# Patient Record
Sex: Female | Born: 1995 | Race: Black or African American | Hispanic: No | Marital: Single | Smoking: Current every day smoker
Health system: Southern US, Community
[De-identification: ages and names within clinical notes are randomized; demographics above are authoritative.]

## PROBLEM LIST (undated history)

## (undated) ENCOUNTER — Inpatient Hospital Stay (HOSPITAL_COMMUNITY): Payer: Self-pay

## (undated) DIAGNOSIS — J45909 Unspecified asthma, uncomplicated: Secondary | ICD-10-CM

## (undated) HISTORY — PX: ROOT CANAL: SHX2363

## (undated) HISTORY — PX: APPENDECTOMY: SHX54

---
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
## 2015-02-12 ENCOUNTER — Encounter (HOSPITAL_COMMUNITY): Payer: Self-pay | Admitting: Emergency Medicine

## 2015-02-12 ENCOUNTER — Emergency Department (HOSPITAL_COMMUNITY)
Admission: EM | Admit: 2015-02-12 | Discharge: 2015-02-12 | Disposition: A | Discharge status: Home / Self Care | Payer: Medicaid Other | Attending: Emergency Medicine | Admitting: Emergency Medicine

## 2015-02-12 DIAGNOSIS — L910 Hypertrophic scar: Secondary | ICD-10-CM

## 2015-02-12 DIAGNOSIS — J45909 Unspecified asthma, uncomplicated: Secondary | ICD-10-CM | POA: Diagnosis not present

## 2015-02-12 DIAGNOSIS — H9201 Otalgia, right ear: Secondary | ICD-10-CM | POA: Diagnosis not present

## 2015-02-12 DIAGNOSIS — Z791 Long term (current) use of non-steroidal anti-inflammatories (NSAID): Secondary | ICD-10-CM | POA: Insufficient documentation

## 2015-02-12 DIAGNOSIS — R51 Headache: Secondary | ICD-10-CM | POA: Diagnosis not present

## 2015-02-12 MED ORDER — CLINDAMYCIN HCL 150 MG PO CAPS: 300.0000 mg | ORAL_CAPSULE | Freq: Three times a day (TID) | ORAL | Status: DC

## 2015-02-12 NOTE — ED Provider Notes (Signed)
CSN: [id number]     Arrival date & time 02/12/15  2056 History  This chart was scribed for a non-physician practitioner, Arthor Captain, PA-C working with Linwood Dibbles, MD by [place] Peace, ED Scribe. The patient was seen in TR11C/TR11C. The patient's care was started at 9:45 PM.    Chief Complaint  Patient presents with  . Headache  . Otalgia      Patient is a 19 y.o. female presenting with headaches and ear pain. The history is provided by the patient. No language interpreter was used.  Headache Associated symptoms: ear pain   Associated symptoms: no fever, no nausea and no vomiting   Otalgia Associated symptoms: headaches   Associated symptoms: no fever and no vomiting   HPI Comments: Adrienne Maldonado is a 19 y.o. female who presents to the Emergency Department complaining of progressively worsening external right ear pain x 4 days with associated "scalp-like" headache. Pt reports pain is primarily to keloid that is attached posteriorly to right ear. Rates pain as 8/10 currently. Pt states she had keloid surgically removed in Jan 2015 but explains it has grown back larger than it was before. No complaints of fever, chills, nausea, or vomiting. History of asthma.     Past Medical History  Diagnosis Date  . Asthma    Past Surgical History  Procedure Laterality Date  . Appendectomy     Family History  Problem Relation Age of Onset  . Diabetes Other   . Hypertension Other    History  Substance Use Topics  . Smoking status: Never Smoker   . Smokeless tobacco: Not on file  . Alcohol Use: No   OB History    No data available     Review of Systems  Constitutional: Negative for fever and chills.  HENT: Positive for ear pain.   Gastrointestinal: Negative for nausea and vomiting.  Neurological: Positive for headaches.      Allergies  Concerta  Home Medications   Prior to Admission medications   Medication Sig Start Date End Date Taking? Authorizing Provider   acetaminophen-codeine (TYLENOL #3) 300-30 MG per tablet Take 1 tablet by mouth every 4 (four) hours as needed. For tooth pain    Historical Provider, MD  ibuprofen (ADVIL,MOTRIN) 800 MG tablet Take 1 tablet (800 mg total) by mouth 3 (three) times daily. 12/02/13   Arthor Captain, PA-C  ibuprofen (ADVIL,MOTRIN) 800 MG tablet Take 1 tablet (800 mg total) by mouth every 6 (six) hours as needed. 06/14/14   Tamika Bush, DO  traMADol (ULTRAM) 50 MG tablet Take 1 tablet (50 mg total) by mouth every 6 (six) hours as needed. 10/09/14   Shalynn Jorstad, PA-C   BP 121/63 mmHg  Pulse 82  Temp(Src) 98.6 F (37 C)  Resp 18  Ht  (1.676 m)  Wt 210 lb (95.255 kg)  BMI 33.91 kg/m2  SpO2 100%  LMP 01/26/2015 Physical Exam  Constitutional: She is oriented to person, place, and time. She appears well-developed and well-nourished. No distress.  HENT:  Head: Normocephalic and atraumatic.  No internal ear pain.   Eyes: Conjunctivae and EOM are normal.  Neck: Neck supple. No tracheal deviation present.  Cardiovascular: Normal rate.   Pulmonary/Chest: Effort normal. No respiratory distress.  Musculoskeletal: Normal range of motion.  Neurological: She is alert and oriented to person, place, and time.  Skin: Skin is warm and dry.  Psychiatric: She has a normal mood and affect. Her behavior is normal.  Nursing note and  vitals reviewed.   ED Course  Procedures (including critical care time) Labs Review Labs Reviewed - No data to display  Imaging Review No results found.   EKG Interpretation None     Medications - No data to display  9:49 PM- Treatment plan was discussed with patient who verbalizes understanding and agrees.   MDM   Final diagnoses:  Earlobe pain, right  Keloid    BP 115/61 mmHg  Pulse 71  Temp(Src) 98.6 F (37 C)  Resp 18  Ht 5\' 6"  (1.676 m)  Wt 210 lb (95.255 kg)  BMI 33.91 kg/m2  SpO2 100%  LMP 01/26/2015\\ Patient likely has developing abscess in the right  earlobe. She has a large keloid present. Ultrasound placed on the scar tissue, did not show any significant area for drainage. Patient placed on clindamycin. I discussed return precautions. She will follow-up with her primary care physician. She appears safe for discharge at this time.  I personally performed the services described in this documentation, which was scribed in my presence. The recorded information has been reviewed and is accurate.      Arthor Captainbigail Jameis Newsham, PA-C 02/12/15 2236  Linwood DibblesJon Knapp, MD 02/12/15 2325

## 2015-02-12 NOTE — Discharge Instructions (Signed)

## 2015-02-12 NOTE — ED Notes (Signed)
PA at BS.  

## 2015-02-12 NOTE — ED Notes (Signed)
Pt. reports headache and right outer ear ache onset last week , denies head injury , no  nausea or vomitting / no fever or chills.

## 2015-02-13 NOTE — Progress Notes (Signed)
ED CM received call from patient concerning Medicaid not covering prescription for Clindamycin. Patient verified x 3 over the phone. Contacted [facility] confirmed non-coverage by Medicaid.  Patient states, she is unable to afford to pay  For medication,  CM  Reviewed record,  Discussed MATCH program and the guidelines including the  $3 co-pay per prescription. Pt enrolled and MATCH letter printed and given to patient with list of participating pharmacies.  Pt verbalized understanding and is agreeable with plan.  [facility] and [facility] given. Pt voiced understanding and appreciation. Reminded patient of the importance of following up with PCP. Pt verbalized understanding teach back done. No further ED CM needs identified.

## 2015-03-20 ENCOUNTER — Encounter (HOSPITAL_COMMUNITY): Payer: Self-pay | Admitting: Emergency Medicine

## 2015-03-20 ENCOUNTER — Emergency Department (HOSPITAL_COMMUNITY)
Admission: EM | Admit: 2015-03-20 | Discharge: 2015-03-21 | Disposition: A | Discharge status: Home / Self Care | Payer: Medicaid Other | Attending: Emergency Medicine | Admitting: Emergency Medicine

## 2015-03-20 ENCOUNTER — Emergency Department (HOSPITAL_COMMUNITY): Payer: Medicaid Other

## 2015-03-20 DIAGNOSIS — W01198A Fall on same level from slipping, tripping and stumbling with subsequent striking against other object, initial encounter: Secondary | ICD-10-CM | POA: Diagnosis not present

## 2015-03-20 DIAGNOSIS — S060X9A Concussion with loss of consciousness of unspecified duration, initial encounter: Secondary | ICD-10-CM | POA: Diagnosis not present

## 2015-03-20 DIAGNOSIS — S0990XA Unspecified injury of head, initial encounter: Secondary | ICD-10-CM | POA: Insufficient documentation

## 2015-03-20 DIAGNOSIS — Y999 Unspecified external cause status: Secondary | ICD-10-CM | POA: Insufficient documentation

## 2015-03-20 DIAGNOSIS — Y9389 Activity, other specified: Secondary | ICD-10-CM | POA: Diagnosis not present

## 2015-03-20 DIAGNOSIS — Y929 Unspecified place or not applicable: Secondary | ICD-10-CM | POA: Insufficient documentation

## 2015-03-20 DIAGNOSIS — J45909 Unspecified asthma, uncomplicated: Secondary | ICD-10-CM | POA: Diagnosis not present

## 2015-03-20 DIAGNOSIS — Z792 Long term (current) use of antibiotics: Secondary | ICD-10-CM | POA: Diagnosis not present

## 2015-03-20 DIAGNOSIS — R51 Headache: Secondary | ICD-10-CM

## 2015-03-20 DIAGNOSIS — R519 Headache, unspecified: Secondary | ICD-10-CM

## 2015-03-20 DIAGNOSIS — R55 Syncope and collapse: Secondary | ICD-10-CM | POA: Diagnosis present

## 2015-03-20 LAB — I-STAT CHEM 8, ED
BUN: 12 mg/dL (ref 6–20)
CALCIUM ION: 1.25 mmol/L — AB (ref 1.12–1.23)
CREATININE: 0.9 mg/dL (ref 0.44–1.00)
Chloride: 101 mmol/L (ref 101–111)
Glucose, Bld: 86 mg/dL (ref 65–99)
HCT: 42 % (ref 36.0–46.0)
Hemoglobin: 14.3 g/dL (ref 12.0–15.0)
POTASSIUM: 3.9 mmol/L (ref 3.5–5.1)
Sodium: 141 mmol/L (ref 135–145)
TCO2: 24 mmol/L (ref 0–100)

## 2015-03-20 LAB — CBC
HCT: 40 % (ref 36.0–46.0)
Hemoglobin: 13.4 g/dL (ref 12.0–15.0)
MCH: 29.1 pg (ref 26.0–34.0)
MCHC: 33.5 g/dL (ref 30.0–36.0)
MCV: 87 fL (ref 78.0–100.0)
PLATELETS: 196 10*3/uL (ref 150–400)
RBC: 4.6 MIL/uL (ref 3.87–5.11)
RDW: 13 % (ref 11.5–15.5)
WBC: 5.3 10*3/uL (ref 4.0–10.5)

## 2015-03-20 LAB — CBG MONITORING, ED: GLUCOSE-CAPILLARY: 122 mg/dL — AB (ref 65–99)

## 2015-03-20 MED ORDER — SODIUM CHLORIDE 0.9 % IV BOLUS (SEPSIS)
1000.0000 mL | Freq: Once | INTRAVENOUS | Status: AC
Start: 2015-03-20 — End: 2015-03-21
  Administered 2015-03-20: 1000 mL via INTRAVENOUS

## 2015-03-20 MED ORDER — DIPHENHYDRAMINE HCL 50 MG/ML IJ SOLN
25.0000 mg | Freq: Once | INTRAMUSCULAR | Status: AC
  Administered 2015-03-20: 25 mg via INTRAVENOUS
  Filled 2015-03-20: qty 1

## 2015-03-20 MED ORDER — DEXAMETHASONE SODIUM PHOSPHATE 10 MG/ML IJ SOLN
10.0000 mg | Freq: Once | INTRAMUSCULAR | Status: AC
  Administered 2015-03-20: 10 mg via INTRAVENOUS
  Filled 2015-03-20: qty 1

## 2015-03-20 MED ORDER — PROCHLORPERAZINE EDISYLATE 5 MG/ML IJ SOLN
10.0000 mg | Freq: Four times a day (QID) | INTRAMUSCULAR | Status: DC | PRN
  Administered 2015-03-20: 10 mg via INTRAVENOUS
  Filled 2015-03-20: qty 2

## 2015-03-20 NOTE — ED Notes (Signed)
Pt states that she ate prior to going to the carwash and felt dizzy so tried to sit down, pt states that her brother stated she hit her head on a brick wall while trying to sit down. Pt denies any cp or sob at this time.

## 2015-03-20 NOTE — ED Provider Notes (Signed)
CSN: [id number]     Arrival date & time 03/20/15  2125 History   First MD Initiated Contact with Patient 03/20/15 2136     Chief Complaint  Patient presents with  . Headache  . Loss of Consciousness   Adrienne Maldonado is a 19 y.o. female With the past medical history significant for asthma who presents for headache and possible syncope. The patient reports that approximately five hours prior to arrival in the ED the patient was standing at a car wash when she began feeling lightheaded and, according to her report, blacked out for several minutes. The patient apparently fell backwards against a wall where she slid down onto her back. The patient says that she woke up and was having a headache for the last several hours. The patient described headache is located in the back of her head and is moderate in severity. The patient says the pain does not radiate. The patient denies any difficulty with ambulation, vision changes, nausea, vomiting, or any neurological deficits. The patient denies any chest pain, shortness of breath, or palpitations. The patient said that she felt her lightheadedness coming on gradually leading to her syncope. The patient says she's never had this before and denies any other symptoms aside from her headache. The patient denies taking any medications and denies any other abnormalities leading up to the event.   (Consider location/radiation/quality/duration/timing/severity/associated sxs/prior Treatment) Patient is a 19 y.o. female presenting with syncope. The history is provided by the patient. No language interpreter was used.  Loss of Consciousness Episode history:  Single Most recent episode:  Today Duration: several. Timing:  Rare Progression:  Resolved Chronicity:  New Context: standing up   Witnessed: yes   Relieved by:  Nothing Worsened by:  Nothing tried Ineffective treatments:  None tried Associated symptoms: headaches   Associated symptoms: no chest pain,  no confusion, no diaphoresis, no dizziness (Lightheadedness), no fever, no focal sensory loss, no focal weakness, no malaise/fatigue, no nausea, no palpitations, no recent fall, no recent injury, no seizures, no shortness of breath, no visual change and no vomiting   Headaches:    Severity:  Moderate   Onset quality:  Gradual   Timing:  Constant   Progression:  Improving   Chronicity:  New   Past Medical History  Diagnosis Date  . Asthma    Past Surgical History  Procedure Laterality Date  . Appendectomy     Family History  Problem Relation Age of Onset  . Diabetes Other   . Hypertension Other    History  Substance Use Topics  . Smoking status: Never Smoker   . Smokeless tobacco: Not on file  . Alcohol Use: No   OB History    No data available     Review of Systems  Constitutional: Negative for fever, chills, malaise/fatigue and diaphoresis.  HENT: Negative for congestion and rhinorrhea.   Respiratory: Negative for cough, choking, chest tightness, shortness of breath, wheezing and stridor.   Cardiovascular: Positive for syncope. Negative for chest pain and palpitations.  Gastrointestinal: Negative for nausea and vomiting.  Genitourinary: Negative for dysuria and flank pain.  Musculoskeletal: Negative for back pain, neck pain and neck stiffness.  Skin: Negative for rash and wound.  Neurological: Positive for syncope, light-headedness and headaches. Negative for dizziness (Lightheadedness), tremors, focal weakness, seizures and facial asymmetry.  Psychiatric/Behavioral: Negative for confusion and agitation.  All other systems reviewed and are negative.     Allergies  Concerta and Ibuprofen  Home Medications  Prior to Admission medications   Medication Sig Start Date End Date Taking? Authorizing Provider  clindamycin (CLEOCIN) 150 MG capsule Take 2 capsules (300 mg total) by mouth 3 (three) times daily. May dispense as  capsules Patient not taking: Reported  on 03/20/2015 02/12/15   Arthor Captain, PA-C  ibuprofen (ADVIL,MOTRIN) 800 MG tablet Take 1 tablet (800 mg total) by mouth 3 (three) times daily. Patient not taking: Reported on 03/20/2015 12/02/13   Arthor Captain, PA-C  ibuprofen (ADVIL,MOTRIN) 800 MG tablet Take 1 tablet (800 mg total) by mouth every 6 (six) hours as needed. Patient not taking: Reported on 03/20/2015 06/14/14   Truddie Coco, DO  traMADol (ULTRAM) 50 MG tablet Take 1 tablet (50 mg total) by mouth every 6 (six) hours as needed. Patient not taking: Reported on 03/20/2015 10/09/14   Arthor Captain, PA-C   BP 127/69 mmHg  Pulse 65  Temp(Src) 98.5 F (36.9 C) (Oral)  Resp 14  Ht  (1.676 m)  Wt 208 lb (94.348 kg)  BMI 33.59 kg/m2  SpO2 99%  LMP 02/25/2015 (Exact Date) Physical Exam  Constitutional: She is oriented to person, place, and time. She appears well-developed and well-nourished. No distress.  HENT:  Head: Normocephalic and atraumatic.  Mouth/Throat: Oropharynx is clear and moist. No oropharyngeal exudate.  Eyes: Conjunctivae are normal. Pupils are equal, round, and reactive to light.  Neck: Normal range of motion.  Cardiovascular: Normal rate, regular rhythm, normal heart sounds and intact distal pulses.   No murmur heard. Pulmonary/Chest: Effort normal and breath sounds normal. No stridor. No respiratory distress.  Abdominal: Soft. Bowel sounds are normal. She exhibits no distension and no mass. There is no tenderness. There is no rebound.  Musculoskeletal: Normal range of motion. She exhibits no tenderness.  Neurological: She is alert and oriented to person, place, and time. She has normal reflexes. She is not disoriented. She displays normal reflexes. No cranial nerve deficit or sensory deficit. She exhibits normal muscle tone. Coordination and gait normal. GCS eye subscore is 4. GCS verbal subscore is 5. GCS motor subscore is 6.  Skin: Skin is warm. She is not diaphoretic. No pallor.  Psychiatric: She has a  normal mood and affect.  Nursing note and vitals reviewed.   ED Course  Procedures (including critical care time) Labs Review Labs Reviewed  CBG MONITORING, ED - Abnormal; Notable for the following:    Glucose-Capillary 122 (*)    All other components within normal limits  I-STAT CHEM 8, ED - Abnormal; Notable for the following:    Calcium, Ion 1.25 (*)    All other components within normal limits  CBC  I-STAT BETA HCG BLOOD, ED ([facility], [facility], [facility] ONLY)    Imaging Review Dg Chest 2 View  03/20/2015   CLINICAL DATA:  Acute onset of dizziness. Status post fall backwards, hitting head on brick wall. Concern for chest injury. Initial encounter.  EXAM: CHEST  2 VIEW  COMPARISON:  None.  FINDINGS: The lungs are well-aerated and clear. There is no evidence of focal opacification, pleural effusion or pneumothorax.  The heart is normal in size; the mediastinal contour is within normal limits. No acute osseous abnormalities are seen.  IMPRESSION: No acute cardiopulmonary process seen. No displaced rib fractures identified.   Electronically Signed   By: Roanna Raider M.D.   On: 03/20/2015 23:51   Ct Head Wo Contrast  03/20/2015   CLINICAL DATA:  Acute onset of posterior headache. Syncope. Dizziness. Initial encounter.  EXAM:  CT HEAD WITHOUT CONTRAST  TECHNIQUE: Contiguous axial images were obtained from the base of the skull through the vertex without intravenous contrast.  COMPARISON:  None.  FINDINGS: There is no evidence of acute infarction, mass lesion, or intra- or extra-axial hemorrhage on CT.  The posterior fossa, including the cerebellum, brainstem and fourth ventricle, is within normal limits. The third and lateral ventricles, and basal ganglia are unremarkable in appearance. The cerebral hemispheres are symmetric in appearance, with normal gray-white differentiation. No mass effect or midline shift is seen.  There is no evidence of fracture; visualized osseous structures are unremarkable in  appearance. The visualized portions of the orbits are within normal limits. The paranasal sinuses and mastoid air cells are well-aerated. No significant soft tissue abnormalities are seen.  IMPRESSION: Unremarkable noncontrast CT of the head.   Electronically Signed   By: Roanna RaiderJeffery  Chang M.D.   On: 03/20/2015 23:45     EKG Interpretation   Date/Time:  Friday Mar 20 2015 21:34:01 EDT Ventricular Rate:  70 PR Interval:  142 QRS Duration: 71 QT Interval:  382 QTC Calculation: 412 R Axis:   60 Text Interpretation:  Sinus rhythm No previous tracing Confirmed by Denton LankSTEINL   MD, Caryn BeeKEVIN ([id number]) on 03/20/2015 10:07:32 PM      MDM   Adrienne Maldonado is a 19 y.o. female With the past medical history significant for asthma who presents for headache and syncope. The patient denied any chest pain or palpitations preceding her symptoms however, she did feel lightheadedness gradually coming on beforehand. Following the patient's fall, the patient had headache prompting her to come for evaluation. On exam, the patient did not have any neurological deficits. The patient did not have any evidence of trauma on inspection of her head.  Given the patient's headache in the setting of possible head trauma, the patient had a CT scan and diagnostic laboratory, EKG, and imaging testing to look for etiology of her syncope.  The patient's entire workup was reassuring for no abnormalities Aside from a slightly elevated calcium of 1.25.  The patient was given a migraine cocktail for headache which completely resolved headache on reassessment.  The patient was reassured about her findings and her headache resolved. The patient was instructed to follow up with her PCP for further management of her syncope and was also given return precautions for any new or worsening symptoms. The patient voiced understanding of the plan of care, had no other questions, concerns, or complaints, and the patient was discharged in good  condition.  This patient seen with Dr. Denton LankSteinl, emergency medicine attending.  Final diagnoses:  Acute nonintractable headache, unspecified headache type      Theda Belfasthris Carrisa Keller, MD 03/21/15 [phone]  Cathren LaineKevin Steinl, MD 03/24/15 [phone]

## 2015-03-20 NOTE — ED Notes (Signed)
Per EMS: pt from home for eval of posterior headache that started after pt states she had syncopal episode. Pt states at 1700 pt felt dizzy while washing a car and "passed out " while trying to get in the car. Unsure of what she hit her head on. Pt states headache since, EMS noted pt alert and oriented, nad noted.

## 2015-03-21 LAB — I-STAT BETA HCG BLOOD, ED (MC, WL, AP ONLY): I-stat hCG, quantitative: <5 m[IU]/mL (ref <5)

## 2015-03-21 NOTE — Discharge Instructions (Signed)

## 2015-03-21 NOTE — ED Notes (Signed)
Discharge instructions reviewed, voiced understanding.  

## 2015-10-06 ENCOUNTER — Encounter (HOSPITAL_COMMUNITY): Payer: Self-pay | Admitting: Emergency Medicine

## 2015-10-06 ENCOUNTER — Emergency Department (HOSPITAL_COMMUNITY)
Admission: EM | Admit: 2015-10-06 | Discharge: 2015-10-06 | Discharge status: Against Medical Advice | Payer: Medicaid Other | Attending: Emergency Medicine | Admitting: Emergency Medicine

## 2015-10-06 ENCOUNTER — Emergency Department (HOSPITAL_COMMUNITY): Payer: Medicaid Other

## 2015-10-06 DIAGNOSIS — Y9289 Other specified places as the place of occurrence of the external cause: Secondary | ICD-10-CM | POA: Diagnosis not present

## 2015-10-06 DIAGNOSIS — Y9389 Activity, other specified: Secondary | ICD-10-CM | POA: Insufficient documentation

## 2015-10-06 DIAGNOSIS — J45909 Unspecified asthma, uncomplicated: Secondary | ICD-10-CM | POA: Diagnosis not present

## 2015-10-06 DIAGNOSIS — W2209XA Striking against other stationary object, initial encounter: Secondary | ICD-10-CM | POA: Insufficient documentation

## 2015-10-06 DIAGNOSIS — S6992XA Unspecified injury of left wrist, hand and finger(s), initial encounter: Secondary | ICD-10-CM | POA: Diagnosis not present

## 2015-10-06 DIAGNOSIS — Y998 Other external cause status: Secondary | ICD-10-CM | POA: Insufficient documentation

## 2015-10-06 DIAGNOSIS — F17210 Nicotine dependence, cigarettes, uncomplicated: Secondary | ICD-10-CM | POA: Insufficient documentation

## 2015-10-06 NOTE — ED Notes (Signed)
Pt brought in by EMS with c/o hand pain  Pt states she hit a wall  Pt states it hurts to move her hand and wrist  No obvious deformity noted

## 2015-10-06 NOTE — ED Notes (Signed)
Patient got back to the assigned hall bed and decided she wanted to leave. She states "I know it is not broken". Pt was alert, oriented x 4, and able to move hand while talking. Pt refused to sign leave without being seen form.

## 2015-10-06 NOTE — ED Notes (Signed)
Pt walked back to [facility] and asked how long the wait would be til the doctor saw them, when told it might be awhile that they still had several in front of them to be seen by the provider pt said" I know its not broke so im just going to go on home." Pt was walked back to lobby. To wait for a ride. RN notified

## 2015-11-12 ENCOUNTER — Emergency Department (HOSPITAL_COMMUNITY): Payer: Medicaid Other

## 2015-11-12 ENCOUNTER — Emergency Department (HOSPITAL_COMMUNITY)
Admission: EM | Admit: 2015-11-12 | Discharge: 2015-11-12 | Disposition: A | Discharge status: Home / Self Care | Payer: Medicaid Other | Attending: Emergency Medicine | Admitting: Emergency Medicine

## 2015-11-12 ENCOUNTER — Encounter (HOSPITAL_COMMUNITY): Payer: Self-pay | Admitting: Emergency Medicine

## 2015-11-12 DIAGNOSIS — S6992XA Unspecified injury of left wrist, hand and finger(s), initial encounter: Secondary | ICD-10-CM | POA: Diagnosis present

## 2015-11-12 DIAGNOSIS — W2209XA Striking against other stationary object, initial encounter: Secondary | ICD-10-CM | POA: Diagnosis not present

## 2015-11-12 DIAGNOSIS — Y998 Other external cause status: Secondary | ICD-10-CM | POA: Diagnosis not present

## 2015-11-12 DIAGNOSIS — J45909 Unspecified asthma, uncomplicated: Secondary | ICD-10-CM | POA: Diagnosis not present

## 2015-11-12 DIAGNOSIS — S60222A Contusion of left hand, initial encounter: Secondary | ICD-10-CM | POA: Diagnosis not present

## 2015-11-12 DIAGNOSIS — Y9289 Other specified places as the place of occurrence of the external cause: Secondary | ICD-10-CM | POA: Diagnosis not present

## 2015-11-12 DIAGNOSIS — F17210 Nicotine dependence, cigarettes, uncomplicated: Secondary | ICD-10-CM | POA: Diagnosis not present

## 2015-11-12 DIAGNOSIS — Y9389 Activity, other specified: Secondary | ICD-10-CM | POA: Insufficient documentation

## 2015-11-12 MED ORDER — TRAMADOL HCL 50 MG PO TABS: 50.0000 mg | ORAL_TABLET | Freq: Four times a day (QID) | ORAL | Status: DC | PRN

## 2015-11-12 NOTE — Discharge Instructions (Signed)
Hand Contusion  A hand contusion is a deep bruise on your hand area. Contusions are the result of an injury that caused bleeding under the skin. The contusion may turn blue, purple, or yellow. Minor injuries will give you a painless contusion, but more severe contusions may stay painful and swollen for a few weeks.  CAUSES   A contusion is usually caused by a blow, trauma, or direct force to an area of the body.  SYMPTOMS    Swelling and redness of the injured area.   Discoloration of the injured area.   Tenderness and soreness of the injured area.   Pain.  DIAGNOSIS   The diagnosis can be made by taking a history and performing a physical exam. An X-ray, CT scan, or MRI may be needed to determine if there were any associated injuries, such as broken bones (fractures).  TREATMENT   Often, the best treatment for a hand contusion is resting, elevating, icing, and applying cold compresses to the injured area. Over-the-counter medicines may also be recommended for pain control.  HOME CARE INSTRUCTIONS    Put ice on the injured area.    Put ice in a plastic bag.    Place a towel between your skin and the bag.    Leave the ice on for 15-20 minutes, 03-04 times a day.   Only take over-the-counter or prescription medicines as directed by your caregiver. Your caregiver may recommend avoiding anti-inflammatory medicines (aspirin, ibuprofen, and naproxen) for 48 hours because these medicines may increase bruising.   If told, use an elastic wrap as directed. This can help reduce swelling. You may remove the wrap for sleeping, showering, and bathing. If your fingers become numb, cold, or blue, take the wrap off and reapply it more loosely.   Elevate your hand with pillows to reduce swelling.   Avoid overusing your hand if it is painful.  SEEK IMMEDIATE MEDICAL CARE IF:    You have increased redness, swelling, or pain in your hand.   Your swelling or pain is not relieved with medicines.   You have loss of feeling in  your hand or are unable to move your fingers.   Your hand turns cold or blue.   You have pain when you move your fingers.   Your hand becomes warm to the touch.   Your contusion does not improve in 2 days.  MAKE SURE YOU:    Understand these instructions.   Will watch your condition.   Will get help right away if you are not doing well or get worse.     This information is not intended to replace advice given to you by your health care provider. Make sure you discuss any questions you have with your health care provider.     Document Released: 04/01/2002 Document Revised: 07/04/2012 Document Reviewed: 04/02/2012  Elsevier Interactive Patient Education 2016 [employer]

## 2015-11-12 NOTE — ED Notes (Signed)
Pt punched the wall with her left hand about 2 days ago.

## 2015-11-12 NOTE — ED Provider Notes (Signed)
CSN: [id number]     Arrival date & time 11/12/15  1823 History  By signing my name below, I, Adrienne Maldonado, attest that this documentation has been prepared under the direction and in the presence of Langston Masker, PA-C Electronically Signed: Soijett Maldonado, ED Scribe. 11/12/2015. 6:46 PM.   Chief Complaint  Patient presents with  . Hand Injury      The history is provided by the patient. No language interpreter was used.    Adrienne Maldonado is a 20 y.o. female who presents to the Emergency Department complaining of left hand injury with associated pain onset last night. She reports that she punched the wall with her left hand. Pt is unsure of the status of her last tetanus shot at this time. Pt is having associated symptoms of joint swelling, left middle finger pain, and left wrist pain. She notes that she has tried ice with no relief of her symptoms. She denies color change, wound, and any other symptoms. She is allergic to ibuprofen and concerta.   Past Medical History  Diagnosis Date  . Asthma    Past Surgical History  Procedure Laterality Date  . Appendectomy     Family History  Problem Relation Age of Onset  . Diabetes Other   . Hypertension Other   . CAD Other    Social History  Substance Use Topics  . Smoking status: Current Some Day Smoker    Types: Cigars  . Smokeless tobacco: None  . Alcohol Use: No   OB History    No data available     Review of Systems  Musculoskeletal: Positive for joint swelling and arthralgias.  Skin: Negative for color change, rash and wound.  All other systems reviewed and are negative.     Allergies  Concerta and Ibuprofen  Home Medications   Prior to Admission medications   Medication Sig Start Date End Date Taking? Authorizing Provider  clindamycin (CLEOCIN) 150 MG capsule Take 2 capsules (300 mg total) by mouth 3 (three) times daily. May dispense as  capsules Patient not taking: Reported on 03/20/2015 02/12/15   Arthor Captain, PA-C  traMADol (ULTRAM) 50 MG tablet Take 1 tablet (50 mg total) by mouth every 6 (six) hours as needed. 11/12/15   Lonia Skinner Sally-Anne Wamble, PA-C   BP 130/71 mmHg  Pulse 78  Temp(Src) 98.5 F (36.9 C) (Oral)  Resp 20  SpO2 100%  LMP 10/16/2015 Physical Exam  Constitutional: She is oriented to person, place, and time. She appears well-developed and well-nourished. No distress.  HENT:  Head: Normocephalic and atraumatic.  Eyes: EOM are normal.  Neck: Neck supple.  Cardiovascular: Normal rate.   Pulmonary/Chest: Effort normal. No respiratory distress.  Abdominal: Soft. There is no tenderness.  Musculoskeletal: Normal range of motion.  Swollen left hand. Abrasion left third metacarpal head.   Neurological: She is alert and oriented to person, place, and time.  Skin: Skin is warm and dry.  Psychiatric: She has a normal mood and affect. Her behavior is normal.  Nursing note and vitals reviewed.   ED Course  Procedures (including critical care time) DIAGNOSTIC STUDIES: Oxygen Saturation is 100% on RA, nl by my interpretation.    COORDINATION OF CARE: 6:46 PM Discussed treatment plan with pt at bedside which includes left hand and wrist xray and pt agreed to plan.    Labs Review Labs Reviewed - No data to display  Imaging Review Dg Wrist Complete Left  11/12/2015  CLINICAL DATA:  20 year old  female punched a wall today complaining of pain in the left wrist and hand. EXAM: LEFT WRIST - COMPLETE 3+ VIEW COMPARISON:  No priors. FINDINGS: Multiple views of the left wrist demonstrate no acute displaced fracture, subluxation, dislocation, or soft tissue abnormality. IMPRESSION: No acute radiographic abnormality of the left wrist. Electronically Signed   By: Trudie Reed M.D.   On: 11/12/2015 19:11   Dg Hand Complete Left  11/12/2015  CLINICAL DATA:  Left wrist and middle finger pain after punching a wall. EXAM: LEFT HAND - COMPLETE 3+ VIEW COMPARISON:  10/06/2015 FINDINGS: There is  no evidence of fracture or dislocation. There is no evidence of arthropathy or other focal bone abnormality. Soft tissues are unremarkable. IMPRESSION: Negative. Electronically Signed   By: Gaylyn Rong M.D.   On: 11/12/2015 19:12   I have personally reviewed and evaluated these images as part of my medical decision-making.   EKG Interpretation None      MDM   Final diagnoses:  Contusion of left hand, initial encounter    Ace wrap Ibuprofen Schedule to see Dr. Janee Morn if pain persist past one week  I personally performed the services in this documentation, which was scribed in my presence.  The recorded information has been reviewed and considered.   Barnet Pall.  Lonia Skinner [place], PA-C 11/12/15 1931  Eber Hong, MD 11/13/15 [phone]

## 2016-01-10 ENCOUNTER — Encounter (HOSPITAL_COMMUNITY): Payer: Self-pay | Admitting: Emergency Medicine

## 2016-01-10 ENCOUNTER — Emergency Department (HOSPITAL_COMMUNITY)
Admission: EM | Admit: 2016-01-10 | Discharge: 2016-01-10 | Disposition: A | Discharge status: Home / Self Care | Payer: Medicaid Other | Attending: Emergency Medicine | Admitting: Emergency Medicine

## 2016-01-10 DIAGNOSIS — K029 Dental caries, unspecified: Secondary | ICD-10-CM | POA: Insufficient documentation

## 2016-01-10 DIAGNOSIS — K08409 Partial loss of teeth, unspecified cause, unspecified class: Secondary | ICD-10-CM | POA: Insufficient documentation

## 2016-01-10 DIAGNOSIS — F17210 Nicotine dependence, cigarettes, uncomplicated: Secondary | ICD-10-CM | POA: Insufficient documentation

## 2016-01-10 DIAGNOSIS — J45909 Unspecified asthma, uncomplicated: Secondary | ICD-10-CM | POA: Diagnosis not present

## 2016-01-10 DIAGNOSIS — K0889 Other specified disorders of teeth and supporting structures: Secondary | ICD-10-CM | POA: Diagnosis not present

## 2016-01-10 MED ORDER — BUPIVACAINE-EPINEPHRINE (PF) 0.5% -1:200000 IJ SOLN
1.8000 mL | Freq: Once | INTRAMUSCULAR | Status: AC
  Administered 2016-01-10: 1.8 mL

## 2016-01-10 MED ORDER — NAPROXEN 250 MG PO TABS
500.0000 mg | ORAL_TABLET | Freq: Once | ORAL | Status: AC
  Administered 2016-01-10: 500 mg via ORAL
  Filled 2016-01-10: qty 2

## 2016-01-10 NOTE — ED Provider Notes (Signed)
CSN: [id number]     Arrival date & time 01/10/16  1924 History  By signing my name below, I, [name], attest that this documentation has been prepared under the direction and in the presence of  Federated Department StoresHanna Patel-Mills, PA-C. Electronically Signed: Doreatha MartinEva Mathews, ED Scribe. 01/10/2016. 7:41 PM.    No chief complaint on file.  The history is provided by the patient. No language interpreter was used.   HPI Comments: Adrienne Maldonado is a 20 y.o. female who presents to the Emergency Department complaining of moderate right lower dental pain onset one week ago. Pt states she was seen by [facility] 5 days ago for her pain and was prescribed amoxicillin. She notes no relief of pain with this treatment. Pt states she has been taking amoxicillin as prescribed. She states she is in the process of scheduling a root canal pending medicaid coverage. Pt denies fever, chills.   Past Medical History  Diagnosis Date  . Asthma    Past Surgical History  Procedure Laterality Date  . Appendectomy     Family History  Problem Relation Age of Onset  . Diabetes Other   . Hypertension Other   . CAD Other    Social History  Substance Use Topics  . Smoking status: Current Some Day Smoker    Types: Cigars  . Smokeless tobacco: Not on file  . Alcohol Use: No   OB History    No data available     Review of Systems  Constitutional: Negative for fever and chills.  HENT: Positive for dental problem.    Allergies  Concerta and Ibuprofen  Home Medications   Prior to Admission medications   Medication Sig Start Date End Date Taking? Authorizing Provider  clindamycin (CLEOCIN) 150 MG capsule Take 2 capsules (300 mg total) by mouth 3 (three) times daily. May dispense as 150mg  capsules Patient not taking: Reported on 03/20/2015 02/12/15   Arthor CaptainAbigail Harris, PA-C  traMADol (ULTRAM) 50 MG tablet Take 1 tablet (50 mg total) by mouth every 6 (six) hours as needed. 11/12/15   Lonia SkinnerLeslie K Sofia, PA-C   BP 135/73 mmHg   Pulse 74  Temp(Src) 98.6 F (37 C) (Oral)  Resp 18  Ht 5' 6.5" (1.689 m)  Wt 90.719 kg  BMI 31.80 kg/m2  SpO2 97% Physical Exam  Constitutional: She is oriented to person, place, and time. She appears well-developed and well-nourished.  HENT:  Head: Normocephalic and atraumatic.  Mouth/Throat: Uvula is midline and oropharynx is clear and moist.    No sign of Ludwig's angina.   Eyes: Conjunctivae and EOM are normal. Pupils are equal, round, and reactive to light.  Neck: Normal range of motion. Neck supple.  Cardiovascular: Normal rate.   Pulmonary/Chest: Effort normal. No respiratory distress.  Abdominal: She exhibits no distension.  Musculoskeletal: Normal range of motion.  Neurological: She is alert and oriented to person, place, and time.  Skin: Skin is warm and dry.  Psychiatric: She has a normal mood and affect. Her behavior is normal.  Nursing note and vitals reviewed.   ED Course  Procedures (including critical care time) DIAGNOSTIC STUDIES: Oxygen Saturation is 97% on RA, normal by my interpretation.    COORDINATION OF CARE: 7:39 PM Discussed treatment plan with pt at bedside which includes dental block and pt agreed to plan.   Dental Block  Consent: Verbal consent obtained. Risks and benefits: risks, benefits and alternatives were discussed Patient identity confirmed: provided demographic data  Time out performed prior  to procedure   Administered 0.7 mL of marcaine w/ EPI.  Pt tolerated procedure well without complication.    MDM   Final diagnoses:  Pain, dental   Meds given in ED:  Medications  bupivacaine-epinephrine (MARCAINE W/ EPI) 0.5% -1:200000 injection 1.8 mL (not administered)  naproxen (NAPROSYN) tablet 500 mg (500 mg Oral Given 01/10/16 2015)    New Prescriptions   No medications on file     BP 135/73 mmHg  Pulse 74  Temp(Src) 98.6 F (37 C) (Oral)  Resp 18  Ht 5' 6.5" (1.689 m)  Wt 90.719 kg  BMI 31.80 kg/m2  SpO2 97%   Here  for evaluation of dental pain. On exam, there is no evidence of a drainable abscess. No trismus, glossal elevation, unilateral tonsillar swelling. No evidence of retropharyngeal or peritonsillar abscess or Ludwig angina. Patient received a dental block in the ED and experiences some relief. Discharged with instructions to follow up with existing dentist. Also given prescription for anti-inflammatories. Patient currently on amoxicillin as prescribed by her doctor 5 days ago. Overall, appears well, nontoxic and appropriate for discharge.    I personally performed the services described in this documentation, which was scribed in my presence. The recorded information has been reviewed and is accurate.   [name], PA-C 01/10/16 2017  Linwood Dibbles, MD 01/10/16 [phone]

## 2016-01-10 NOTE — ED Notes (Signed)
Patient reports a week long history of dental pain.  Pain right lower jaw.

## 2016-01-18 DIAGNOSIS — Z791 Long term (current) use of non-steroidal anti-inflammatories (NSAID): Secondary | ICD-10-CM | POA: Insufficient documentation

## 2016-01-18 DIAGNOSIS — J45909 Unspecified asthma, uncomplicated: Secondary | ICD-10-CM | POA: Diagnosis not present

## 2016-01-18 DIAGNOSIS — F17210 Nicotine dependence, cigarettes, uncomplicated: Secondary | ICD-10-CM | POA: Insufficient documentation

## 2016-01-18 DIAGNOSIS — R51 Headache: Secondary | ICD-10-CM | POA: Insufficient documentation

## 2016-01-18 NOTE — ED Notes (Addendum)
Pt c/o headache and pressure behind her eyes, pt reports taking motrin today with no relief. Pt ambulatory to WR with steady gait Pt c/o HA x 1 week, pt states she has not had a HA this bad in quite a while, +nausea, no light sensitivity.

## 2016-01-19 ENCOUNTER — Encounter (HOSPITAL_COMMUNITY): Payer: Self-pay | Admitting: Emergency Medicine

## 2016-01-19 ENCOUNTER — Emergency Department (HOSPITAL_COMMUNITY)
Admission: EM | Admit: 2016-01-19 | Discharge: 2016-01-19 | Disposition: A | Discharge status: Home / Self Care | Payer: Medicaid Other | Attending: Emergency Medicine | Admitting: Emergency Medicine

## 2016-01-19 DIAGNOSIS — R519 Headache, unspecified: Secondary | ICD-10-CM

## 2016-01-19 DIAGNOSIS — R51 Headache: Secondary | ICD-10-CM

## 2016-01-19 MED ORDER — METOCLOPRAMIDE HCL 5 MG/ML IJ SOLN
10.0000 mg | Freq: Once | INTRAMUSCULAR | Status: AC
  Administered 2016-01-19: 10 mg via INTRAMUSCULAR
  Filled 2016-01-19: qty 2

## 2016-01-19 MED ORDER — DIPHENHYDRAMINE HCL 25 MG PO CAPS
25.0000 mg | ORAL_CAPSULE | Freq: Once | ORAL | Status: AC
  Administered 2016-01-19: 25 mg via ORAL
  Filled 2016-01-19: qty 1

## 2016-01-19 NOTE — ED Notes (Signed)
NP at bedside.

## 2016-01-19 NOTE — ED Provider Notes (Signed)
CSN: [id number]     Arrival date & time 01/18/16  2252 History   First MD Initiated Contact with Patient 01/19/16 0110     Chief Complaint  Patient presents with  . Headache     (Consider location/radiation/quality/duration/timing/severity/associated sxs/prior Treatment) HPI Comments: Is a 20 year old who presents with one-week worth of left temporal headache.  She states this is typical headache occasion for her, but it's lasting longer.  She states s.  No sinus congestion or recent viral illness .  No urinary tract symptoms he's taken over-the-counter Advil without any complaint.  She has nausea.  No photophobia, no visual disturbance  Patient is a 20 y.o. female presenting with headaches. The history is provided by the patient.  Headache Pain location:  L temporal Quality:  Dull Severity at highest:  6/10 Onset quality:  Gradual Duration:  1 week Timing:  Constant Progression:  Unchanged Chronicity:  Recurrent Similar to prior headaches: yes   Context: not activity, not exposure to bright light, not caffeine, not coughing, not eating, not exposure to cold air and not straining   Relieved by:  Nothing Worsened by:  Nothing Ineffective treatments:  NSAIDs Associated symptoms: no congestion, no dizziness, no nausea, no photophobia and no sinus pressure     Past Medical History  Diagnosis Date  . Asthma    Past Surgical History  Procedure Laterality Date  . Appendectomy     Family History  Problem Relation Age of Onset  . Diabetes Other   . Hypertension Other   . CAD Other    Social History  Substance Use Topics  . Smoking status: Current Some Day Smoker    Types: Cigars  . Smokeless tobacco: None  . Alcohol Use: No   OB History    No data available     Review of Systems  Constitutional: Negative for activity change and appetite change.  HENT: Negative for congestion and sinus pressure.   Eyes: Negative for photophobia.  Gastrointestinal: Negative for nausea.   Neurological: Positive for headaches. Negative for dizziness.  All other systems reviewed and are negative.     Allergies  Concerta and Ibuprofen  Home Medications   Prior to Admission medications   Medication Sig Start Date End Date Taking? Authorizing Provider  naproxen sodium (ANAPROX) 220 MG tablet Take 220 mg by mouth 2 (two) times daily with a meal.   Yes Historical Provider, MD  clindamycin (CLEOCIN) 150 MG capsule Take 2 capsules (300 mg total) by mouth 3 (three) times daily. May dispense as 150mg  capsules Patient not taking: Reported on 03/20/2015 02/12/15   Arthor CaptainAbigail Harris, PA-C  traMADol (ULTRAM) 50 MG tablet Take 1 tablet (50 mg total) by mouth every 6 (six) hours as needed. Patient not taking: Reported on 01/19/2016 11/12/15   Elson AreasLeslie K Sofia, PA-C   BP 119/74 mmHg  Pulse 75  Temp(Src) 97.8 F (36.6 C) (Oral)  Resp 20  SpO2 97%  LMP 12/22/2015 Physical Exam  Constitutional: She appears well-developed and well-nourished.  HENT:  Head: Normocephalic.  Eyes: Pupils are equal, round, and reactive to light.  Neck: Normal range of motion.  Cardiovascular: Normal rate and regular rhythm.   Pulmonary/Chest: Effort normal.  Musculoskeletal: Normal range of motion.  Neurological: She is alert.  Skin: Skin is warm.  Nursing note and vitals reviewed.   ED Course  Procedures (including critical care time) Labs Review Labs Reviewed - No data to display  Imaging Review No results found. I have personally reviewed and  evaluated these images and lab results as part of my medical decision-making.   EKG Interpretation None     After IM Reglan and PO Benadry HA has improved   MDM   Final diagnoses:  Acute nonintractable headache, unspecified headache type         Earley Favor, NP 01/19/16 0236  Earley Favor, NP 01/19/16 1191  Tomasita Crumble, MD 01/19/16 [id number]

## 2016-01-19 NOTE — Discharge Instructions (Signed)

## 2016-03-05 ENCOUNTER — Encounter (HOSPITAL_COMMUNITY): Payer: Self-pay | Admitting: *Deleted

## 2016-03-05 ENCOUNTER — Emergency Department (HOSPITAL_COMMUNITY)
Admission: EM | Admit: 2016-03-05 | Discharge: 2016-03-05 | Disposition: A | Discharge status: Home / Self Care | Payer: Medicaid Other | Attending: Emergency Medicine | Admitting: Emergency Medicine

## 2016-03-05 DIAGNOSIS — J45909 Unspecified asthma, uncomplicated: Secondary | ICD-10-CM | POA: Diagnosis not present

## 2016-03-05 DIAGNOSIS — F17210 Nicotine dependence, cigarettes, uncomplicated: Secondary | ICD-10-CM | POA: Insufficient documentation

## 2016-03-05 DIAGNOSIS — Z791 Long term (current) use of non-steroidal anti-inflammatories (NSAID): Secondary | ICD-10-CM | POA: Diagnosis not present

## 2016-03-05 DIAGNOSIS — J029 Acute pharyngitis, unspecified: Secondary | ICD-10-CM | POA: Diagnosis present

## 2016-03-05 LAB — RAPID STREP SCREEN (MED CTR MEBANE ONLY): STREPTOCOCCUS, GROUP A SCREEN (DIRECT): NEGATIVE

## 2016-03-05 MED ORDER — IBUPROFEN 400 MG PO TABS
800.0000 mg | ORAL_TABLET | Freq: Once | ORAL | Status: AC
  Administered 2016-03-05: 800 mg via ORAL
  Filled 2016-03-05: qty 4

## 2016-03-05 MED ORDER — DEXAMETHASONE SODIUM PHOSPHATE 10 MG/ML IJ SOLN
10.0000 mg | Freq: Once | INTRAMUSCULAR | Status: AC
  Administered 2016-03-05: 10 mg via INTRAMUSCULAR
  Filled 2016-03-05: qty 1

## 2016-03-05 MED ORDER — IBUPROFEN 800 MG PO TABS: 800.0000 mg | ORAL_TABLET | Freq: Three times a day (TID) | ORAL | Status: DC

## 2016-03-05 NOTE — ED Notes (Signed)
Pt asking how long is the wait

## 2016-03-05 NOTE — ED Provider Notes (Signed)
CSN: [id number]     Arrival date & time 03/05/16  1624 History   First MD Initiated Contact with Patient 03/05/16 1641     Chief Complaint  Patient presents with  . Sore Throat     (Consider location/radiation/quality/duration/timing/severity/associated sxs/prior Treatment) HPI Adrienne Maldonado is a 20 y.o. female with PMH significant for asthma who presents with 3 day history of gradual onset, constant, unchanged sore throat.  Patient states "i feel like my uvula is swollen".  No prior treatment.  Aggravating factors include swallowing.  Able to eat and drink okay.  No fever, chills, ear pain, cough, rhinorrhea, SOB, CP, N/V/D, or abdominal pain.  Past Medical History  Diagnosis Date  . Asthma    Past Surgical History  Procedure Laterality Date  . Appendectomy     Family History  Problem Relation Age of Onset  . Diabetes Other   . Hypertension Other   . CAD Other    Social History  Substance Use Topics  . Smoking status: Current Some Day Smoker    Types: Cigars  . Smokeless tobacco: None  . Alcohol Use: No   OB History    No data available     Review of Systems All other systems negative unless otherwise stated in HPI    Allergies  Concerta and Ibuprofen  Home Medications   Prior to Admission medications   Medication Sig Start Date End Date Taking? Authorizing Provider  clindamycin (CLEOCIN) 150 MG capsule Take 2 capsules (300 mg total) by mouth 3 (three) times daily. May dispense as  capsules Patient not taking: Reported on 03/20/2015 02/12/15   Arthor Captain, PA-C  ibuprofen (ADVIL,MOTRIN) 800 MG tablet Take 1 tablet (800 mg total) by mouth 3 (three) times daily. 03/05/16   Cheri Fowler, PA-C  naproxen sodium (ANAPROX) 220 MG tablet Take 220 mg by mouth 2 (two) times daily with a meal.    Historical Provider, MD  traMADol (ULTRAM) 50 MG tablet Take 1 tablet (50 mg total) by mouth every 6 (six) hours as needed. Patient not taking: Reported on 01/19/2016  11/12/15   Elson Areas, PA-C   BP 125/56 mmHg  Pulse 63  Temp(Src) 98.9 F (37.2 C)  Resp 16  Ht  (1.702 m)  Wt 88.451 kg  BMI 30.53 kg/m2  SpO2 96%  LMP 02/20/2016 Physical Exam  Constitutional: She is oriented to person, place, and time. She appears well-developed and well-nourished. She is active.  Non-toxic appearance. She does not have a sickly appearance. She does not appear ill.  HENT:  Head: Normocephalic and atraumatic.  Right Ear: Tympanic membrane and external ear normal. Tympanic membrane is not erythematous and not bulging.  Left Ear: Tympanic membrane and external ear normal. Tympanic membrane is not erythematous and not bulging.  Nose: Nose normal.  Mouth/Throat: Uvula is midline and mucous membranes are normal. No trismus in the jaw. Uvula swelling (very mild) present. Posterior oropharyngeal edema and posterior oropharyngeal erythema present. No tonsillar abscesses.  Uvula midline, mildly swollen.  Minimal post-oropharyngeal edema and erythema.  Tolerating secretions without difficulty.   Neck: Normal range of motion. Neck supple.  No nuchal rigidity.   Cardiovascular: Normal rate and regular rhythm.   Pulmonary/Chest: Effort normal and breath sounds normal. No respiratory distress. She has no wheezes. She has no rales.  Abdominal: Soft. Bowel sounds are normal. She exhibits no distension. There is no tenderness.  Musculoskeletal: Normal range of motion.  Lymphadenopathy:    She has no  cervical adenopathy.  Neurological: She is alert and oriented to person, place, and time.  Skin: Skin is warm and dry.  Psychiatric: She has a normal mood and affect. Her behavior is normal.    ED Course  Procedures (including critical care time) Labs Review Labs Reviewed  RAPID STREP SCREEN (NOT AT [facility])  CULTURE, GROUP A STREP [facility]    Imaging Review No results found. I have personally reviewed and evaluated these images and lab results as part of my medical  decision-making.   EKG Interpretation None      MDM   Final diagnoses:  Pharyngitis  . Patient presents with sore throat.  VSS, NAD.  Appear well, non-toxic or septic.  Uvula midline.  Tolerating secretions without difficulty.  Doubt peritonsillar abscess or deep space infection.  Able to tolerate PO.  Patient given Decadron and Ibuprofen.  Rapid strep negative.  Plan to discharge home with ibuprofen.  Discussed return precautions.  Patient agrees and acknowledges the above plan for discharge.    Cheri FowlerKayla Killian Ress, PA-C 03/05/16 1812  Gerhard Munchobert Lockwood, MD 03/05/16 2202

## 2016-03-05 NOTE — Discharge Instructions (Signed)

## 2016-03-05 NOTE — ED Notes (Signed)
The pt is c/o a sore throat for 3 days no known temp    Swelling in the back of her throat.. lmp 2 weeks ago

## 2016-03-05 NOTE — ED Notes (Signed)
The pt  Came in by ems amb

## 2016-03-08 LAB — CULTURE, GROUP A STREP (THRC)

## 2016-05-30 ENCOUNTER — Ambulatory Visit (HOSPITAL_BASED_OUTPATIENT_CLINIC_OR_DEPARTMENT_OTHER)
Admit: 2016-05-30 | Discharge: 2016-05-30 | Disposition: A | Discharge status: Home / Self Care | Payer: Medicaid Other | Attending: Emergency Medicine | Admitting: Emergency Medicine

## 2016-05-30 ENCOUNTER — Emergency Department (HOSPITAL_COMMUNITY)
Admission: EM | Admit: 2016-05-30 | Discharge: 2016-05-30 | Disposition: A | Discharge status: Home / Self Care | Payer: Medicaid Other | Attending: Emergency Medicine | Admitting: Emergency Medicine

## 2016-05-30 ENCOUNTER — Encounter (HOSPITAL_COMMUNITY): Payer: Self-pay | Admitting: Vascular Surgery

## 2016-05-30 DIAGNOSIS — M79609 Pain in unspecified limb: Secondary | ICD-10-CM | POA: Diagnosis not present

## 2016-05-30 DIAGNOSIS — F17210 Nicotine dependence, cigarettes, uncomplicated: Secondary | ICD-10-CM | POA: Diagnosis not present

## 2016-05-30 DIAGNOSIS — J45909 Unspecified asthma, uncomplicated: Secondary | ICD-10-CM | POA: Diagnosis not present

## 2016-05-30 DIAGNOSIS — M79662 Pain in left lower leg: Secondary | ICD-10-CM | POA: Insufficient documentation

## 2016-05-30 DIAGNOSIS — Z79899 Other long term (current) drug therapy: Secondary | ICD-10-CM | POA: Insufficient documentation

## 2016-05-30 LAB — BASIC METABOLIC PANEL
ANION GAP: 6 (ref 5–15)
BUN: <5 mg/dL — ABNORMAL LOW (ref 6–20)
CO2: 28 mmol/L (ref 22–32)
Calcium: 9.5 mg/dL (ref 8.9–10.3)
Chloride: 105 mmol/L (ref 101–111)
Creatinine, Ser: 0.83 mg/dL (ref 0.44–1.00)
GFR calc Af Amer: >60 mL/min (ref >60)
Glucose, Bld: 73 mg/dL (ref 65–99)
POTASSIUM: 4.1 mmol/L (ref 3.5–5.1)
Sodium: 139 mmol/L (ref 135–145)

## 2016-05-30 MED ORDER — CYCLOBENZAPRINE HCL 10 MG PO TABS
10.0000 mg | ORAL_TABLET | Freq: Once | ORAL | Status: AC
  Administered 2016-05-30: 10 mg via ORAL
  Filled 2016-05-30: qty 1

## 2016-05-30 MED ORDER — SODIUM CHLORIDE 0.9 % IV BOLUS (SEPSIS): 1000.0000 mL | Freq: Once | INTRAVENOUS | Status: DC

## 2016-05-30 MED ORDER — SODIUM CHLORIDE 0.9 % IV BOLUS (SEPSIS): 20.0000 mL/kg | Freq: Once | INTRAVENOUS | Status: DC

## 2016-05-30 MED ORDER — SODIUM CHLORIDE 0.9 % IV BOLUS (SEPSIS)
1000.0000 mL | Freq: Once | INTRAVENOUS | Status: AC
  Administered 2016-05-30: 1000 mL via INTRAVENOUS

## 2016-05-30 MED ORDER — CYCLOBENZAPRINE HCL 10 MG PO TABS: 10.0000 mg | ORAL_TABLET | Freq: Two times a day (BID) | ORAL | 0 refills | Status: DC | PRN

## 2016-05-30 NOTE — ED Notes (Signed)
MD at bedside. 

## 2016-05-30 NOTE — ED Notes (Signed)
Pt returned from vascular

## 2016-05-30 NOTE — ED Notes (Signed)
Pt transported to vascular.  °

## 2016-05-30 NOTE — ED Provider Notes (Signed)
[facility]-EMERGENCY DEPT Provider Note   CSN: [id number] Arrival date & time: 05/30/16  1318  First Provider Contact: First MD [id number] Contact with Patient 05/30/16 1647     By signing my name below, I, Adrienne Maldonado, attest that this documentation has been prepared under the direction and in the presence of Adrienne Hummeross Alexsandro Salek, MD. Electronically Signed: Rosario AdieWilliam Andrew Maldonado, ED Scribe. 05/30/16. 4:56 PM.  History   Chief Complaint Chief Complaint  Patient presents with  . Leg Pain   The history is provided by the patient. No language interpreter was used.  Leg Pain   This is a new problem. The current episode started more than 2 days ago. Episode frequency: intemittently. The problem has not changed since onset.The pain is present in the left lower leg. The pain is at a severity of 10/10. Pertinent negatives include no numbness and full range of motion. Exacerbated by: ambulation. Treatments tried: stretching. The treatment provided no relief. There has been no history of extremity trauma. Family history is significant for no rheumatoid arthritis and no gout.   HPI Comments: Adrienne Maldonado is a [age] y.o. female with a PMHx of asthma, who presents to the Emergency Department complaining of sudden onset, unchanged, intermittent left sided upper calf pain x ~3 days. Relative reports associated mild swelling to the area since the onset of her pain. Pt reports that she felt a cramp to the area while laying down ~3 days ago. At that time she stood up, and upon getting up she felt a sudden sensation of pain causing her to fall to the ground, and her pain has persisted since. No LOC or head injury upon fall. They have tried stretching the area with minimal improvement of her pain. Her pain is moderately exacerbated with ambulation. No recent injury/trauma to the area or otherwise. No daily medicines or birth control regimen. Pt does smoke cigarettes. Denies fever, weakness, numbness, tingling, or any  other symptoms.    Past Medical History:  Diagnosis Date  . Asthma    There are no active problems to display for this patient.  Past Surgical History:  Procedure Laterality Date  . APPENDECTOMY     OB History    No data available     Home Medications    Prior to Admission medications   Medication Sig Start Date End Date Taking? Authorizing Provider  clindamycin (CLEOCIN) 150 MG capsule Take 2 capsules (300 mg total) by mouth 3 (three) times daily. May dispense as 150mg  capsules Patient not taking: Reported on 03/20/2015 02/12/15   Arthor CaptainAbigail Harris, PA-C  ibuprofen (ADVIL,MOTRIN) 800 MG tablet Take 1 tablet (800 mg total) by mouth 3 (three) times daily. 03/05/16   Cheri FowlerKayla Rose, PA-C  naproxen sodium (ANAPROX) 220 MG tablet Take 220 mg by mouth 2 (two) times daily with a meal.    Historical Provider, MD  traMADol (ULTRAM) 50 MG tablet Take 1 tablet (50 mg total) by mouth every 6 (six) hours as needed. Patient not taking: Reported on 01/19/2016 11/12/15   Elson AreasLeslie K Sofia, PA-C   Family History Family History  Problem Relation Age of Onset  . Diabetes Other   . Hypertension Other   . CAD Other    Social History Social History  Substance Use Topics  . Smoking status: Current Some Day Smoker    Types: Cigars  . Smokeless tobacco: Never Used  . Alcohol use No   Allergies   Concerta [methylphenidate] and Ibuprofen  Review of Systems Review of  Systems  Constitutional: Negative for fever.  Musculoskeletal: Positive for myalgias.  Neurological: Negative for syncope, weakness and numbness.  All other systems reviewed and are negative.  Physical Exam Updated Vital Signs BP 112/64 (BP Location: Left Arm)   Pulse (!) 56   Temp 98 F (36.7 C) (Oral)   Resp 17   SpO2 100%   Physical Exam  Constitutional: She is oriented to person, place, and time. She appears well-developed and well-nourished.  HENT:  Head: Normocephalic and atraumatic.  Right Ear: External ear normal.  Left  Ear: External ear normal.  Mouth/Throat: Oropharynx is clear and moist.  Eyes: Conjunctivae and EOM are normal.  Neck: Normal range of motion. Neck supple.  Cardiovascular: Normal rate, normal heart sounds and intact distal pulses.   Pulmonary/Chest: Effort normal and breath sounds normal.  Abdominal: Soft. Bowel sounds are normal. There is no tenderness. There is no rebound.  Musculoskeletal: Normal range of motion. She exhibits tenderness.  Left calf is tender. Minimal swelling. No warmth to touch. No redness. No pain in the thigh.   Neurological: She is alert and oriented to person, place, and time.  Skin: Skin is warm.  Nursing note and vitals reviewed.  ED Treatments / Results  DIAGNOSTIC STUDIES: Oxygen Saturation is 100% on RA, normal by my interpretation.   COORDINATION OF CARE: 4:56 PM-Discussed next steps with pt. Pt verbalized understanding and is agreeable with the plan.   Labs (all labs ordered are listed, but only abnormal results are displayed) Labs Reviewed - No data to display  Radiology No results found.  Procedures Procedures (including critical care time)  Medications Ordered in ED Medications - No data to display  Initial Impression / Assessment and Plan / ED Course  I have reviewed the triage vital signs and the nursing notes.  Pertinent labs & imaging results that were available during my care of the patient were reviewed by me and considered in my medical decision making (see chart for details).  Clinical Course    20 year old female who presents for left calf pain. The pain started 2 days ago and feels like a muscle cramp to her. However despite stretching she continues to have pain. Patient is obese, and does smoke, however she is not on birth control pills, no recent surgeries or immobilization.  No redness. We'll obtain ultrasound to ensure no DVT. We'll obtain screening baseline labs.  Preliminary vascular report shows no sign of DVT. Patient  feeling a little bit better after IV fluids. We'll discharge home with muscle relaxer. Patient to follow-up with PCP or return here if not improved in 2-3 days. Discussed symptoms that warrant reevaluation.  Final Clinical Impressions(s) / ED Diagnoses   Final diagnoses:  None    New Prescriptions New Prescriptions   No medications on file   I personally performed the services described in this documentation, which was scribed in my presence. The recorded information has been reviewed and is accurate.        Adrienne Hummer, MD 05/30/16 2002

## 2016-05-30 NOTE — Progress Notes (Signed)
Orthopedic Tech Progress Note Patient Details:  Rosey BathShantianna J Bruemmer 1996/06/01 [id number]  Ortho Devices Type of Ortho Device: Crutches Ortho Device/Splint Interventions: Application   Saul FordyceJennifer C Bianna Haran 05/30/2016, 7:32 PM

## 2016-05-30 NOTE — Progress Notes (Signed)
VASCULAR LAB PRELIMINARY  PRELIMINARY  PRELIMINARY  PRELIMINARY  Left lower extremity venous duplex completed.    Preliminary report:  There is no DVT or SVT noted in the left lower extremity.   Yuri Flener, RVT 05/30/2016, 6:38 PM

## 2016-05-30 NOTE — ED Triage Notes (Signed)
Pt reports to the ED for eval of posterior left calf pain. States she got a "charlie-horse" a few days ago and ever since then she has had pain. She also reports it feels like a knot in her left. Some swelling noted to the left leg per pt. Pt denies any CP, SOB, or HA. Pt A&Ox4, resp e/u, and skin warm and dry.

## 2016-06-23 ENCOUNTER — Emergency Department (HOSPITAL_COMMUNITY)
Admission: EM | Admit: 2016-06-23 | Discharge: 2016-06-23 | Disposition: A | Discharge status: Home / Self Care | Payer: Medicaid Other | Attending: Emergency Medicine | Admitting: Emergency Medicine

## 2016-06-23 ENCOUNTER — Encounter (HOSPITAL_COMMUNITY): Payer: Self-pay | Admitting: Emergency Medicine

## 2016-06-23 ENCOUNTER — Emergency Department (HOSPITAL_COMMUNITY): Payer: Medicaid Other

## 2016-06-23 DIAGNOSIS — S60022A Contusion of left index finger without damage to nail, initial encounter: Secondary | ICD-10-CM | POA: Diagnosis not present

## 2016-06-23 DIAGNOSIS — F17210 Nicotine dependence, cigarettes, uncomplicated: Secondary | ICD-10-CM | POA: Diagnosis not present

## 2016-06-23 DIAGNOSIS — Y999 Unspecified external cause status: Secondary | ICD-10-CM | POA: Insufficient documentation

## 2016-06-23 DIAGNOSIS — J45909 Unspecified asthma, uncomplicated: Secondary | ICD-10-CM | POA: Insufficient documentation

## 2016-06-23 DIAGNOSIS — W230XXA Caught, crushed, jammed, or pinched between moving objects, initial encounter: Secondary | ICD-10-CM | POA: Diagnosis not present

## 2016-06-23 DIAGNOSIS — Y929 Unspecified place or not applicable: Secondary | ICD-10-CM | POA: Diagnosis not present

## 2016-06-23 DIAGNOSIS — Y939 Activity, unspecified: Secondary | ICD-10-CM | POA: Diagnosis not present

## 2016-06-23 DIAGNOSIS — Z79899 Other long term (current) drug therapy: Secondary | ICD-10-CM | POA: Insufficient documentation

## 2016-06-23 DIAGNOSIS — S6992XA Unspecified injury of left wrist, hand and finger(s), initial encounter: Secondary | ICD-10-CM | POA: Diagnosis present

## 2016-06-23 DIAGNOSIS — S6000XA Contusion of unspecified finger without damage to nail, initial encounter: Secondary | ICD-10-CM

## 2016-06-23 NOTE — ED Triage Notes (Addendum)
Pt c/o 15 min PTA shut her L pointer finger in a car door. Small puncture wound noted to tip of finger. No bleeding at this time.

## 2016-06-23 NOTE — ED Provider Notes (Signed)
[facility] Provider Note   CSN: [id number] Arrival date & time: 06/23/16  1654  By signing my name below, I, Phillis HaggisGabriella Gaje, attest that this documentation has been prepared under the direction and in the presence of Felicie Mornavid Shaquon Gropp, NP-C. Electronically Signed: Phillis HaggisGabriella Gaje, ED Scribe. 06/23/16. 5:36 PM.  History   Chief Complaint Chief Complaint  Patient presents with  . Hand Pain   The history is provided by the patient. No language interpreter was used.  Hand Pain  This is a new problem. The current episode started less than 1 hour ago. The problem occurs constantly. The problem has been gradually worsening. She has tried nothing for the symptoms.   HPI Comments: Adrienne Maldonado is a 20 y.o. female who presents to the Emergency Department complaining of a left index finger injury onset 30 minutes ago. Pt states that she shut the finger in the car door. She notes a small wound to the tip of the finger, which is where she has the most pain. Bleeding is currently controlled. She has not taken anything for her pain. She denies numbness or weakness. Pt is utd on TDAP.   Past Medical History:  Diagnosis Date  . Asthma     There are no active problems to display for this patient.   Past Surgical History:  Procedure Laterality Date  . APPENDECTOMY      OB History    No data available       Home Medications    Prior to Admission medications   Medication Sig Start Date End Date Taking? Authorizing Provider  clindamycin (CLEOCIN) 150 MG capsule Take 2 capsules (300 mg total) by mouth 3 (three) times daily. May dispense as 150mg  capsules Patient not taking: Reported on 03/20/2015 02/12/15   Arthor CaptainAbigail Harris, PA-C  cyclobenzaprine (FLEXERIL) 10 MG tablet Take 1 tablet (10 mg total) by mouth 2 (two) times daily as needed for muscle spasms. 05/30/16   Niel Hummeross Kuhner, MD  ibuprofen (ADVIL,MOTRIN) 800 MG tablet Take 1 tablet (800 mg total) by mouth 3 (three) times daily. 03/05/16    Cheri FowlerKayla Rose, PA-C  naproxen sodium (ANAPROX) 220 MG tablet Take 220 mg by mouth 2 (two) times daily with a meal.    Historical Provider, MD  traMADol (ULTRAM) 50 MG tablet Take 1 tablet (50 mg total) by mouth every 6 (six) hours as needed. Patient not taking: Reported on 01/19/2016 11/12/15   Elson AreasLeslie K Sofia, PA-C    Family History Family History  Problem Relation Age of Onset  . Diabetes Other   . Hypertension Other   . CAD Other     Social History Social History  Substance Use Topics  . Smoking status: Current Some Day Smoker    Types: Cigars  . Smokeless tobacco: Never Used  . Alcohol use No     Allergies   Concerta [methylphenidate] and Ibuprofen   Review of Systems Review of Systems  Musculoskeletal: Positive for arthralgias.  Skin: Positive for wound.  Neurological: Negative for weakness and numbness.  All other systems reviewed and are negative.    Physical Exam Updated Vital Signs BP 127/65   Pulse 89   Temp 98.5 F (36.9 C) (Oral)   Resp 16   LMP 06/20/2016   SpO2 98%   Physical Exam  Constitutional: She is oriented to person, place, and time. She appears well-developed and well-nourished.  HENT:  Head: Normocephalic and atraumatic.  Eyes: Conjunctivae and EOM are normal. Pupils are equal, round, and reactive  to light.  Neck: Normal range of motion. Neck supple.  Cardiovascular: Normal rate and regular rhythm.   Pulmonary/Chest: Effort normal.  Musculoskeletal: Normal range of motion.  Abrasion to PIP joint of the dorsal aspect of the left index finger; small wound at the PIP joint of the finger on the palmar surface; sensation intact  Neurological: She is alert and oriented to person, place, and time.  Skin: Skin is warm and dry.  Psychiatric: She has a normal mood and affect. Her behavior is normal.  Nursing note and vitals reviewed.    ED Treatments / Results  DIAGNOSTIC STUDIES: Oxygen Saturation is 98% on RA, normal by my interpretation.     COORDINATION OF CARE: 5:35 PM-Discussed treatment plan which includes x-ray with pt at bedside and pt agreed to plan.    Labs (all labs ordered are listed, but only abnormal results are displayed) Labs Reviewed - No data to display  EKG  EKG Interpretation None       Radiology Dg Finger Index Left  Result Date: 06/23/2016 CLINICAL DATA:  Pain at distal LEFT index finger after slamming finger in door today, initial encounter EXAM: LEFT INDEX FINGER 2+V COMPARISON:  None FINDINGS: Osseous mineralization normal. Joint spaces preserved. No acute fracture, dislocation, or bone destruction. IMPRESSION: No acute osseous abnormalities. Electronically Signed   By: Ulyses Southward M.D.   On: 06/23/2016 17:34    Procedures Procedures (including critical care time)  Medications Ordered in ED Medications - No data to display   Initial Impression / Assessment and Plan / ED Course  I have reviewed the triage vital signs and the nursing notes.  Pertinent labs & imaging results that were available during my care of the patient were reviewed by me and considered in my medical decision making (see chart for details).  Clinical Course     Final Clinical Impressions(s) / ED Diagnoses   Patient X-Ray negative for obvious fracture or dislocation. Pain managed in ED. Pt advised to follow up with orthopedics if symptoms persist. Patient given brace while in ED, conservative therapy recommended and discussed. Patient will be dc home & is agreeable with above plan.  Final diagnoses:  None  I personally performed the services described in this documentation, which was scribed in my presence. The recorded information has been reviewed and is accurate.    New Prescriptions New Prescriptions   No medications on file     Felicie Morn, NP 06/24/16 0121    Zadie Rhine, MD 06/28/16 2211

## 2016-06-23 NOTE — ED Notes (Signed)
D/C paper reviewed. Patient has [ain 10/10 but verbalizes understanding of tylenol for pain. Denies questions. Splint applied by Steward DroneBrenda. Leaving ambulatory with friend

## 2016-10-13 IMAGING — CR DG WRIST COMPLETE 3+V*L*
4 series · 4 of 4 positions shown · non-contrast
Comparison: No priors.

CLINICAL DATA: 19-year-old female punched a wall today complaining
of pain in the left wrist and hand.

EXAM:
LEFT WRIST - COMPLETE 3+ VIEW

[x wrist pa left]
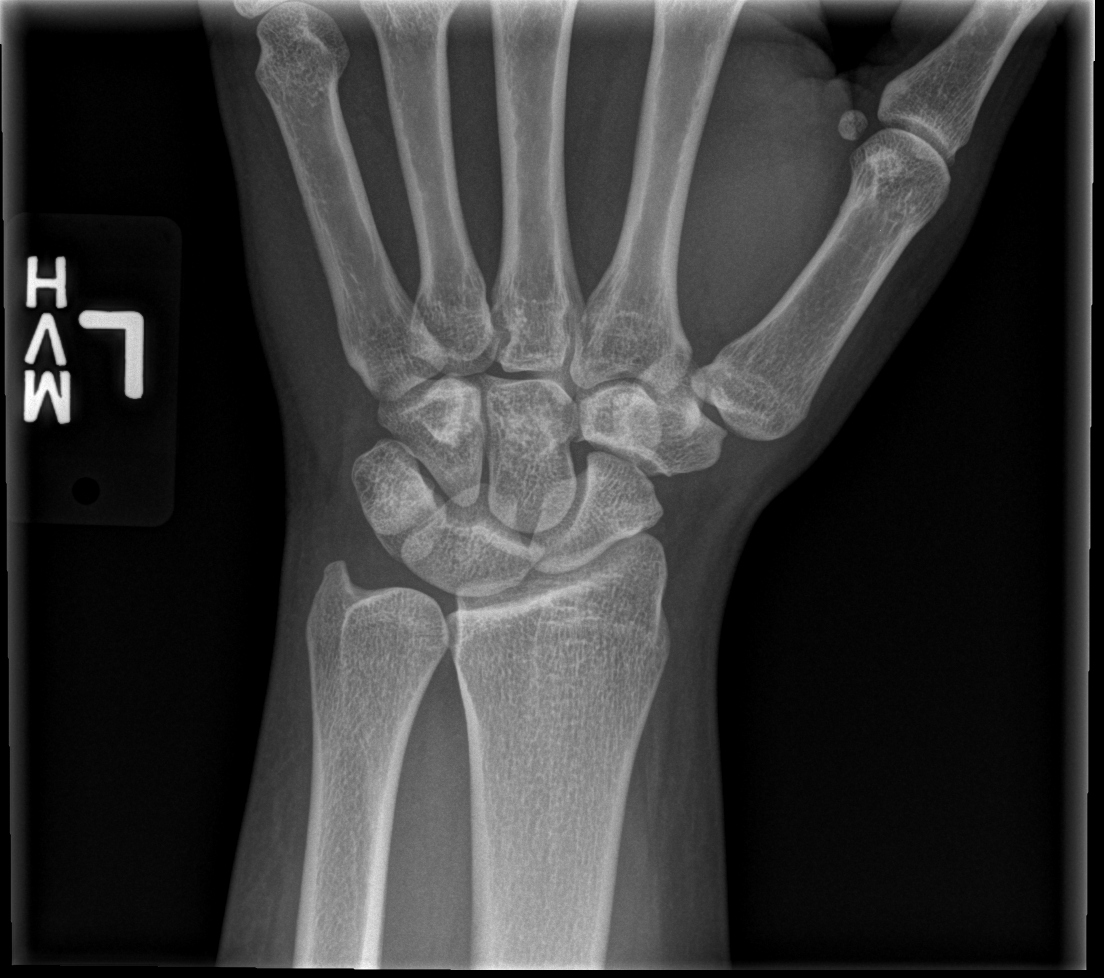

[x wrist obl left]
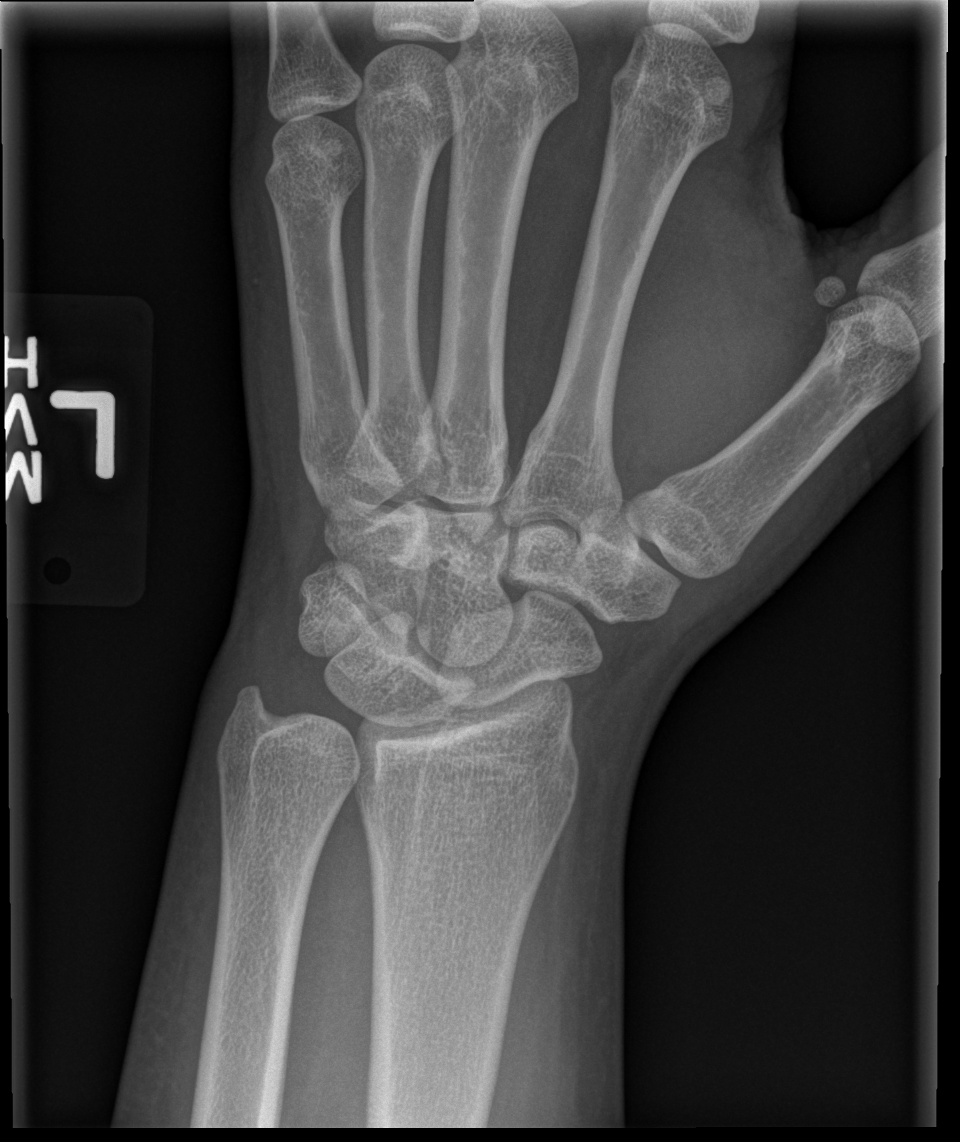

[x wrist lat left]
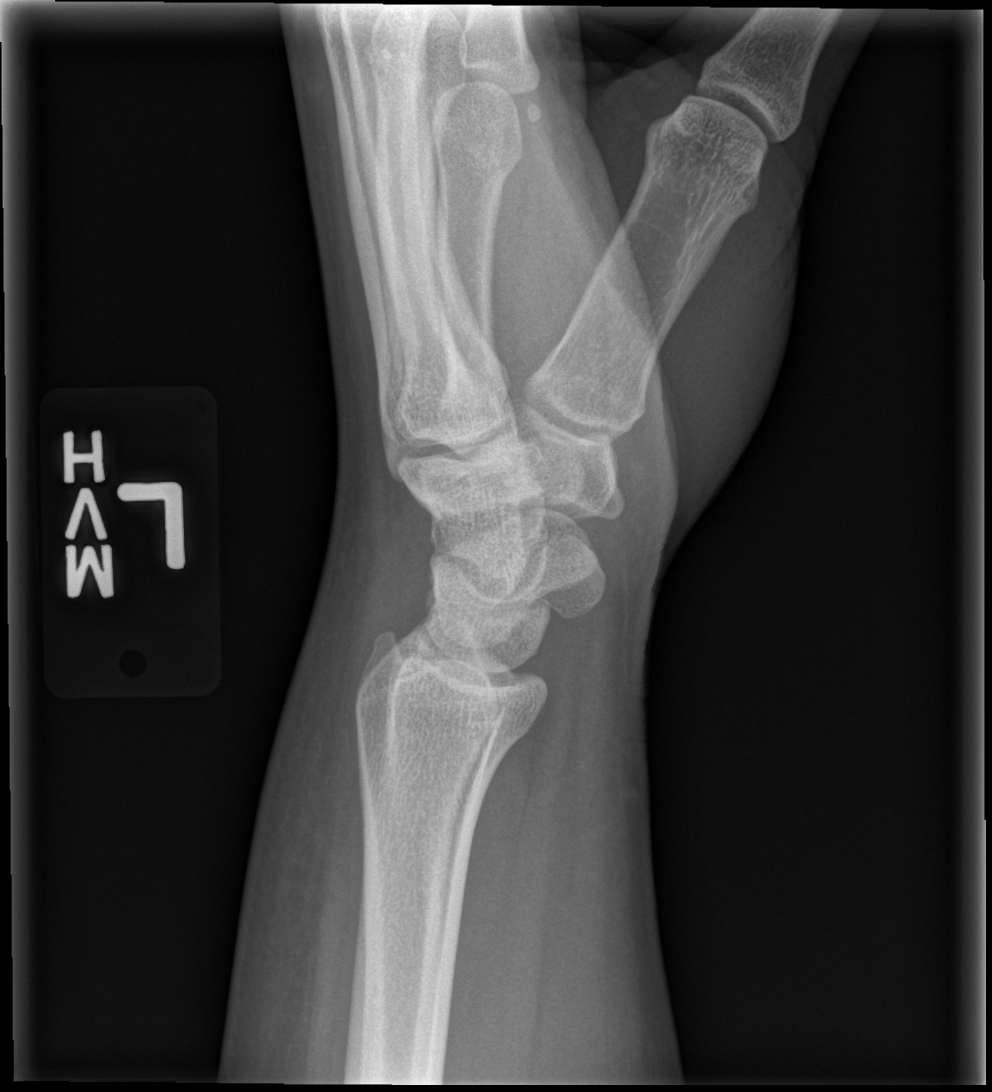

[x wrist navicular view left]
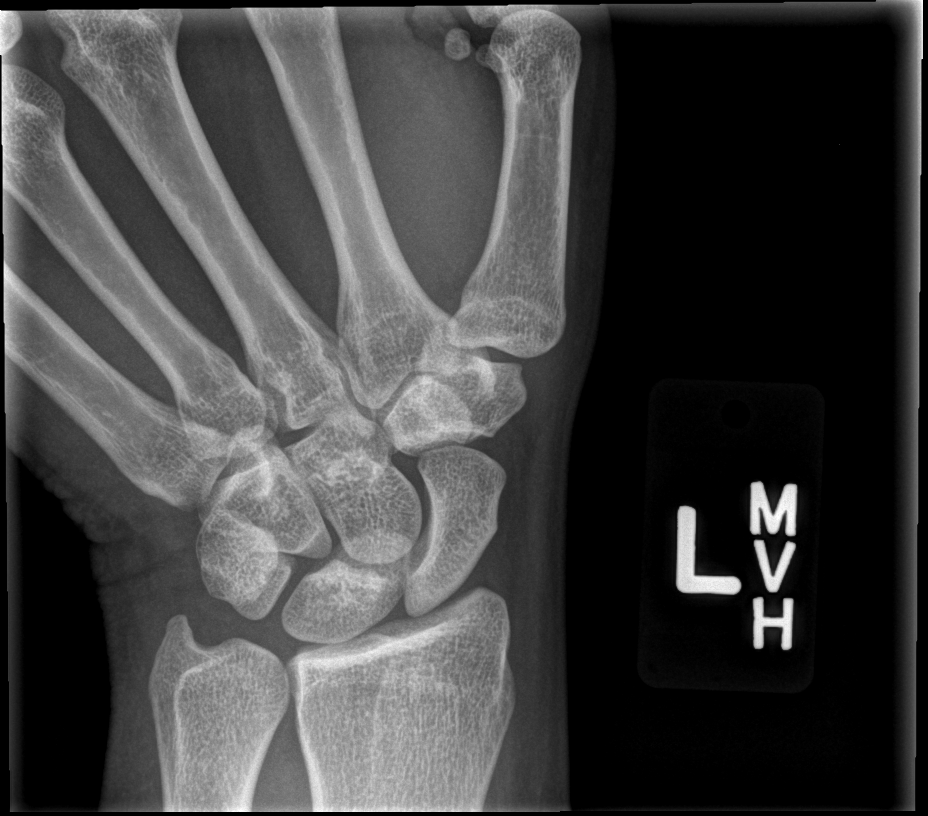

[4 of 4 positions shown; findings below may reference images not displayed]

FINDINGS: Multiple views of the left wrist demonstrate no acute displaced
fracture, subluxation, dislocation, or soft tissue abnormality.
IMPRESSION: No acute radiographic abnormality of the left wrist.

## 2016-10-28 ENCOUNTER — Emergency Department (HOSPITAL_COMMUNITY)
Admission: EM | Admit: 2016-10-28 | Discharge: 2016-10-29 | Disposition: A | Discharge status: Home / Self Care | Payer: Managed Care, Other (non HMO) | Attending: Emergency Medicine | Admitting: Emergency Medicine

## 2016-10-28 ENCOUNTER — Encounter (HOSPITAL_COMMUNITY): Payer: Self-pay

## 2016-10-28 DIAGNOSIS — F17290 Nicotine dependence, other tobacco product, uncomplicated: Secondary | ICD-10-CM | POA: Diagnosis not present

## 2016-10-28 DIAGNOSIS — Z79899 Other long term (current) drug therapy: Secondary | ICD-10-CM | POA: Insufficient documentation

## 2016-10-28 DIAGNOSIS — J45909 Unspecified asthma, uncomplicated: Secondary | ICD-10-CM | POA: Diagnosis not present

## 2016-10-28 DIAGNOSIS — L910 Hypertrophic scar: Secondary | ICD-10-CM | POA: Diagnosis present

## 2016-10-28 NOTE — ED Triage Notes (Signed)
No aNSWER WHEN CALLED

## 2016-10-28 NOTE — Discharge Instructions (Signed)
[facility] for further evaluation and treatment of your keloid. If you keloid begins to bleed again, apply direct, consistent pressure for 20 minutes. If you cannot get the bleeding to stop, please return to the emergency department. Please return to the emergency department for any other new or concerning symptoms.

## 2016-10-28 NOTE — ED Triage Notes (Signed)
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 No fevers.

## 2016-10-28 NOTE — ED Provider Notes (Signed)
[facility]-EMERGENCY DEPT Provider Note   CSN: [id number] Arrival date & time: 10/28/16  2100  By signing my name below, I, Modena Jansky, attest that this documentation has been prepared under the direction and in the presence of non-physician practitioner, Glenford Bayley, PA-C. Electronically Signed: Modena Jansky, Scribe. 10/28/2016. 11:25 PM.  History   Chief Complaint Chief Complaint  Patient presents with  . keloid   The history is provided by the patient. No language interpreter was used.   HPI Comments: Adrienne Maldonado is a 21 y.o. female who presents to the Emergency Department complaining of a wound (behind right ear) that started today. She states she has a keloid that she has had for many years that started bleedingSpontaneously PTA. She reports associated symptoms of constant moderate pain and itching to site. No modifying factors. Patient also reports that she is currently experiencing a migraine that is typical of her regular migraines. She denies any numbness or tingling, photophobia. Patient also denies any other ear pain, chest pain, shortness of breath, fevers, abdominal pain, nausea, vomiting.  Past Medical History:  Diagnosis Date  . Asthma     There are no active problems to display for this patient.   Past Surgical History:  Procedure Laterality Date  . APPENDECTOMY      OB History    No data available       Home Medications    Prior to Admission medications   Medication Sig Start Date End Date Taking? Authorizing Provider  clindamycin (CLEOCIN) 150 MG capsule Take 2 capsules (300 mg total) by mouth 3 (three) times daily. May dispense as 150mg  capsules Patient not taking: Reported on 03/20/2015 02/12/15   Arthor Captain, PA-C  cyclobenzaprine (FLEXERIL) 10 MG tablet Take 1 tablet (10 mg total) by mouth 2 (two) times daily as needed for muscle spasms. 05/30/16   Niel Hummer, MD  ibuprofen (ADVIL,MOTRIN) 800 MG tablet Take 1 tablet (800 mg total) by mouth 3 (three)  times daily. 03/05/16   Cheri Fowler, PA-C  naproxen sodium (ANAPROX) 220 MG tablet Take 220 mg by mouth 2 (two) times daily with a meal.    Historical Provider, MD  traMADol (ULTRAM) 50 MG tablet Take 1 tablet (50 mg total) by mouth every 6 (six) hours as needed. Patient not taking: Reported on 01/19/2016 11/12/15   Elson Areas, PA-C    Family History Family History  Problem Relation Age of Onset  . Diabetes Other   . Hypertension Other   . CAD Other     Social History Social History  Substance Use Topics  . Smoking status: Current Some Day Smoker    Types: Cigars  . Smokeless tobacco: Never Used  . Alcohol use No     Allergies   Concerta [methylphenidate] and Ibuprofen   Review of Systems Review of Systems  Constitutional: Negative for chills and fever.  HENT: Negative for facial swelling and sore throat.   Eyes: Negative for photophobia.  Respiratory: Negative for shortness of breath.   Cardiovascular: Negative for chest pain.  Gastrointestinal: Negative for abdominal pain, nausea and vomiting.  Genitourinary: Negative for dysuria.  Musculoskeletal: Negative for back pain.  Skin: Positive for wound (behind right ear). Negative for rash.  Neurological: Negative for headaches.  Psychiatric/Behavioral: The patient is not nervous/anxious.      Physical Exam Updated Vital Signs BP 126/61 (BP Location: Right Arm)   Pulse 65   Temp 98.5 F (36.9 C) (Oral)   Resp 18   Ht  5\' 7"  (1.702 m)   Wt 200 lb (90.7 kg)   SpO2 100%   BMI 31.32 kg/m   Physical Exam  Constitutional: She appears well-developed and well-nourished. No distress.  HENT:  Head: Normocephalic and atraumatic.  Mouth/Throat: Oropharynx is clear and moist. No oropharyngeal exudate.  Eyes: Conjunctivae and EOM are normal. Pupils are equal, round, and reactive to light. Right eye exhibits no discharge. Left eye exhibits no discharge. No scleral icterus.  Neck: Normal range of motion. Neck supple. No  thyromegaly present.  Cardiovascular: Normal rate, regular rhythm, normal heart sounds and intact distal pulses.  Exam reveals no gallop and no friction rub.   No murmur heard. Pulmonary/Chest: Effort normal and breath sounds normal. No stridor. No respiratory distress. She has no wheezes. She has no rales.  Abdominal: Soft. Bowel sounds are normal. She exhibits no distension. There is no tenderness. There is no rebound and no guarding.  Musculoskeletal: She exhibits no edema.  Lymphadenopathy:    She has no cervical adenopathy.  Neurological: She is alert. Coordination normal.  CN 3-12 intact; normal sensation throughout; 5/5 strength in all 4 extremities; equal bilateral grip strength; no ataxia on finger to nose  Skin: Skin is warm and dry. No rash noted. She is not diaphoretic. No pallor.  Keloid to right ear lobe with an area of ceased bleeding. No active bleeding. Mild TTP.   Psychiatric: She has a normal mood and affect.  Nursing note and vitals reviewed.      ED Treatments / Results  DIAGNOSTIC STUDIES: Oxygen Saturation is 100% on RA, normal by my interpretation.    COORDINATION OF CARE: 11:29 PM- Pt advised of plan for treatment and pt agrees.  Labs (all labs ordered are listed, but only abnormal results are displayed) Labs Reviewed - No data to display  EKG  EKG Interpretation None       Radiology No results found.  Procedures Procedures (including critical care time)  Medications Ordered in ED Medications - No data to display   Initial Impression / Assessment and Plan / ED Course  I have reviewed the triage vital signs and the nursing notes.  Pertinent labs & imaging results that were available during my care of the patient were reviewed by me and considered in my medical decision making (see chart for details).  Clinical Course     Patient with keloid without active bleeding. Wound dressed with bulky dressing and bacitracin ointment. Patient  referred to general surgery for further evaluation and treatment and possible excision. Patient with headache typical of her normal headache. No concern for meningitis, subarachnoid hemorrhage, temporal arteritis, or any other emergent pathology. Normal neuro exam without focal deficits. Return precautions discussed. Patient understands and agrees with plan. Patient vitals stable throughout ED course and discharged in satisfactory condition. I discussed patient case with Dr. Karma GanjaLinker who guided the patient's management and agrees with plan.  Final Clinical Impressions(s) / ED Diagnoses   Final diagnoses:  Keloid    New Prescriptions Discharge Medication List as of 10/28/2016 11:43 PM     I personally performed the services described in this documentation, which was scribed in my presence. The recorded information has been reviewed and is accurate.     Emi Holeslexandra M Jontavia Leatherbury, PA-C 10/29/16 0215    Jerelyn ScottMartha Linker, MD 10/29/16 [phone]

## 2016-10-29 NOTE — ED Notes (Signed)
Gauze dressing and bacitracin applied to right ear keloid

## 2017-06-22 ENCOUNTER — Emergency Department (HOSPITAL_COMMUNITY)
Admission: EM | Admit: 2017-06-22 | Discharge: 2017-06-22 | Discharge status: Against Medical Advice | Payer: 59 | Attending: Emergency Medicine | Admitting: Emergency Medicine

## 2017-06-22 ENCOUNTER — Encounter (HOSPITAL_COMMUNITY): Payer: Self-pay | Admitting: Emergency Medicine

## 2017-06-22 DIAGNOSIS — R55 Syncope and collapse: Secondary | ICD-10-CM | POA: Insufficient documentation

## 2017-06-22 DIAGNOSIS — Z5321 Procedure and treatment not carried out due to patient leaving prior to being seen by health care provider: Secondary | ICD-10-CM | POA: Diagnosis not present

## 2017-06-22 LAB — BASIC METABOLIC PANEL
ANION GAP: 6 (ref 5–15)
BUN: 6 mg/dL (ref 6–20)
CALCIUM: 8.9 mg/dL (ref 8.9–10.3)
CO2: 28 mmol/L (ref 22–32)
Chloride: 108 mmol/L (ref 101–111)
Creatinine, Ser: 1.1 mg/dL — ABNORMAL HIGH (ref 0.44–1.00)
GFR calc Af Amer: >60 mL/min (ref >60)
GFR calc non Af Amer: >60 mL/min (ref >60)
GLUCOSE: 97 mg/dL (ref 65–99)
POTASSIUM: 4 mmol/L (ref 3.5–5.1)
Sodium: 142 mmol/L (ref 135–145)

## 2017-06-22 LAB — URINALYSIS, ROUTINE W REFLEX MICROSCOPIC
Bilirubin Urine: NEGATIVE
Glucose, UA: NEGATIVE mg/dL
Hgb urine dipstick: NEGATIVE
KETONES UR: 5 mg/dL — AB
Nitrite: NEGATIVE
PROTEIN: 100 mg/dL — AB
Specific Gravity, Urine: 1.027 (ref 1.005–1.030)
pH: 5 (ref 5.0–8.0)

## 2017-06-22 LAB — CBC
HEMATOCRIT: 46.1 % — AB (ref 36.0–46.0)
HEMOGLOBIN: 15 g/dL (ref 12.0–15.0)
MCH: 29.2 pg (ref 26.0–34.0)
MCHC: 32.5 g/dL (ref 30.0–36.0)
MCV: 89.7 fL (ref 78.0–100.0)
Platelets: 141 10*3/uL — ABNORMAL LOW (ref 150–400)
RBC: 5.14 MIL/uL — ABNORMAL HIGH (ref 3.87–5.11)
RDW: 13 % (ref 11.5–15.5)
WBC: 4.8 10*3/uL (ref 4.0–10.5)

## 2017-06-22 LAB — I-STAT BETA HCG BLOOD, ED (MC, WL, AP ONLY): I-stat hCG, quantitative: <5 m[IU]/mL (ref <5)

## 2017-06-22 NOTE — ED Notes (Signed)
Pt called to obtain vitals, pt did not answer x3. 

## 2017-06-22 NOTE — ED Notes (Signed)
No response in the lobby.

## 2017-06-22 NOTE — ED Notes (Signed)
EDP aware of pt's BP, given water, will recheck.

## 2017-06-22 NOTE — ED Triage Notes (Signed)
Per EMS, pt gave plasma today and has not eaten. Pt at [employer] and had a syncopal episode. Pt A&O x 4 at this time. Only complaint is generalized weakness.

## 2017-12-25 ENCOUNTER — Encounter (HOSPITAL_COMMUNITY): Payer: Self-pay | Admitting: Emergency Medicine

## 2017-12-25 ENCOUNTER — Ambulatory Visit (HOSPITAL_COMMUNITY)
Admission: EM | Admit: 2017-12-25 | Discharge: 2017-12-25 | Disposition: A | Discharge status: Home / Self Care | Payer: Medicaid Other | Attending: Emergency Medicine | Admitting: Emergency Medicine

## 2017-12-25 ENCOUNTER — Other Ambulatory Visit: Payer: Self-pay

## 2017-12-25 DIAGNOSIS — R109 Unspecified abdominal pain: Secondary | ICD-10-CM | POA: Diagnosis not present

## 2017-12-25 DIAGNOSIS — Z3201 Encounter for pregnancy test, result positive: Secondary | ICD-10-CM

## 2017-12-25 DIAGNOSIS — Z3490 Encounter for supervision of normal pregnancy, unspecified, unspecified trimester: Secondary | ICD-10-CM

## 2017-12-25 DIAGNOSIS — N644 Mastodynia: Secondary | ICD-10-CM

## 2017-12-25 LAB — POCT PREGNANCY, URINE: PREG TEST UR: POSITIVE — AB

## 2017-12-25 LAB — POCT URINALYSIS DIP (DEVICE)
BILIRUBIN URINE: NEGATIVE
Glucose, UA: NEGATIVE mg/dL
Hgb urine dipstick: NEGATIVE
KETONES UR: 15 mg/dL — AB
LEUKOCYTES UA: NEGATIVE
NITRITE: NEGATIVE
Protein, ur: NEGATIVE mg/dL
Specific Gravity, Urine: >=1.03 (ref 1.005–1.030)
Urobilinogen, UA: 1 mg/dL (ref 0.0–1.0)
pH: 5.5 (ref 5.0–8.0)

## 2017-12-25 MED ORDER — PRENATAL VITAMINS 0.8 MG PO TABS: 1.0000 | ORAL_TABLET | Freq: Every day | ORAL | 0 refills | Status: DC

## 2017-12-25 NOTE — ED Provider Notes (Signed)
HPI  SUBJECTIVE:  Adrienne Maldonado is a 22 y.o. female who presents for a pregnancy test.  She states that she had a positive pregnancy test at home.  Reports morning sickness, but is generally tolerating p.o., states that her breasts have been tender. She reports lower intermittent abdominal cramping that lasts minutes and then spontaneously resolves over the past month.  No dysuria, urgency, frequency, cloudy odorous urine, hematuria. No vaginal bleeding, odor, discharge, genital rash.  There are no aggravating or alleviating factors.  She has not tried anything for this.  She has never been pregnant before.  No history of UTI, STDs, BV, yeast, PID.  PMD: None.  Past Medical History:  Diagnosis Date  . Asthma     Past Surgical History:  Procedure Laterality Date  . APPENDECTOMY      Family History  Problem Relation Age of Onset  . Diabetes Other   . Hypertension Other   . CAD Other     Social History   Tobacco Use  . Smoking status: Current Every Day Smoker    Packs/day: 0.25    Types: Cigarettes  . Smokeless tobacco: Never Used  Substance Use Topics  . Alcohol use: Yes  . Drug use: Yes    Types: Marijuana    No current facility-administered medications for this encounter.   Current Outpatient Medications:  .  Prenatal Multivit-Min-Fe-FA (PRENATAL VITAMINS) 0.8 MG tablet, Take 1 tablet by mouth daily., Disp: 30 tablet, Rfl: 0  Allergies  Allergen Reactions  . Concerta [Methylphenidate]     Unknown  . Ibuprofen     headache     ROS  As noted in HPI.   Physical Exam  BP 131/62 (BP Location: Right Arm)   Pulse 63   Temp 98.8 F (37.1 C) (Oral)   LMP 11/06/2017 (Approximate)   SpO2 100%   Constitutional: Well developed, well nourished, no acute distress Eyes:  EOMI, conjunctiva normal bilaterally HENT: Normocephalic, atraumatic,mucus membranes moist Respiratory: Normal inspiratory effort Cardiovascular: Normal rate GI: nondistended skin: No rash,  skin intact Musculoskeletal: no deformities Neurologic: Alert & oriented x 3, no focal neuro deficits Psychiatric: Speech and behavior appropriate   ED Course   Medications - No data to display  Orders Placed This Encounter  Procedures  . Ambulatory referral to Obstetrics / Gynecology    Referral Priority:   Routine    Referral Type:   Consultation    Referral Reason:   Specialty Services Required    Requested Specialty:   Obstetrics and Gynecology    Number of Visits Requested:   1  . Pregnancy, urine POC    Standing Status:   Standing    Number of Occurrences:   1  . POCT urinalysis dip (device)    Standing Status:   Standing    Number of Occurrences:   1    Results for orders placed or performed during the hospital encounter of 12/25/17 (from the past 24 hour(s))  Pregnancy, urine POC     Status: Abnormal   Collection Time: 12/25/17  9:15 PM  Result Value Ref Range   Preg Test, Ur POSITIVE (A) NEGATIVE  POCT urinalysis dip (device)     Status: Abnormal   Collection Time: 12/25/17  9:17 PM  Result Value Ref Range   Glucose, UA NEGATIVE NEGATIVE mg/dL   Bilirubin Urine NEGATIVE NEGATIVE   Ketones, ur 15 (A) NEGATIVE mg/dL   Specific Gravity, Urine >=1.030 1.005 - 1.030   Hgb urine dipstick  NEGATIVE NEGATIVE   pH 5.5 5.0 - 8.0   Protein, ur NEGATIVE NEGATIVE mg/dL   Urobilinogen, UA 1.0 0.0 - 1.0 mg/dL   Nitrite NEGATIVE NEGATIVE   Leukocytes, UA NEGATIVE NEGATIVE   No results found.  ED Clinical Impression  Pregnancy, unspecified gestational age  Positive pregnancy test   ED Assessment/Plan  Patient is pregnant.  She does not have a urinary tract infection.  Will order an OB/GYN referral and also give her names of several practices.  We will also send home with prenatal vitamins.  Discussed labs, MDM, plan and followup with patient. Discussed sn/sx that should prompt return to the ED. patient agrees with plan.   Meds ordered this encounter  Medications   . Prenatal Multivit-Min-Fe-FA (PRENATAL VITAMINS) 0.8 MG tablet    Sig: Take 1 tablet by mouth daily.    Dispense:  30 tablet    Refill:  0    *This clinic note was created using Scientist, clinical (histocompatibility and immunogenetics)Dragon dictation software. Therefore, there may be occasional mistakes despite careful proofreading.   ?   Domenick GongMortenson, Harlene Petralia, MD 12/27/17 1343

## 2017-12-25 NOTE — ED Triage Notes (Signed)
Pt took a test at home and she states it was positive.  She is here today to confirm.

## 2017-12-25 NOTE — Discharge Instructions (Signed)
Start the prenatal vitamins.  Stop smoking.  Follow-up with 1 of the OB/GYN practices listed below.

## 2017-12-31 ENCOUNTER — Other Ambulatory Visit: Payer: Self-pay

## 2017-12-31 ENCOUNTER — Inpatient Hospital Stay (HOSPITAL_COMMUNITY)
Admission: AD | Admit: 2017-12-31 | Discharge: 2017-12-31 | Disposition: A | Discharge status: Home / Self Care | Payer: Medicaid Other | Source: Ambulatory Visit | Attending: Obstetrics and Gynecology | Admitting: Obstetrics and Gynecology

## 2017-12-31 ENCOUNTER — Encounter: Payer: Self-pay | Admitting: Medical

## 2017-12-31 ENCOUNTER — Inpatient Hospital Stay (HOSPITAL_COMMUNITY): Payer: Medicaid Other

## 2017-12-31 DIAGNOSIS — O219 Vomiting of pregnancy, unspecified: Secondary | ICD-10-CM | POA: Diagnosis not present

## 2017-12-31 DIAGNOSIS — R109 Unspecified abdominal pain: Secondary | ICD-10-CM | POA: Diagnosis not present

## 2017-12-31 DIAGNOSIS — Z3A09 9 weeks gestation of pregnancy: Secondary | ICD-10-CM | POA: Diagnosis not present

## 2017-12-31 DIAGNOSIS — O26891 Other specified pregnancy related conditions, first trimester: Secondary | ICD-10-CM | POA: Insufficient documentation

## 2017-12-31 DIAGNOSIS — O26899 Other specified pregnancy related conditions, unspecified trimester: Secondary | ICD-10-CM

## 2017-12-31 DIAGNOSIS — Z3A08 8 weeks gestation of pregnancy: Secondary | ICD-10-CM

## 2017-12-31 DIAGNOSIS — Z3491 Encounter for supervision of normal pregnancy, unspecified, first trimester: Secondary | ICD-10-CM

## 2017-12-31 LAB — CBC
HCT: 35.8 % — ABNORMAL LOW (ref 36.0–46.0)
HEMOGLOBIN: 12.5 g/dL (ref 12.0–15.0)
MCH: 30.3 pg (ref 26.0–34.0)
MCHC: 34.9 g/dL (ref 30.0–36.0)
MCV: 86.7 fL (ref 78.0–100.0)
PLATELETS: 147 10*3/uL — AB (ref 150–400)
RBC: 4.13 MIL/uL (ref 3.87–5.11)
RDW: 12.9 % (ref 11.5–15.5)
WBC: 2.5 10*3/uL — ABNORMAL LOW (ref 4.0–10.5)

## 2017-12-31 LAB — WET PREP, GENITAL
Sperm: NONE SEEN
TRICH WET PREP: NONE SEEN
Yeast Wet Prep HPF POC: NONE SEEN

## 2017-12-31 LAB — URINALYSIS, ROUTINE W REFLEX MICROSCOPIC
BILIRUBIN URINE: NEGATIVE
Glucose, UA: NEGATIVE mg/dL
Hgb urine dipstick: NEGATIVE
Ketones, ur: NEGATIVE mg/dL
Nitrite: NEGATIVE
PH: 5 (ref 5.0–8.0)
Protein, ur: 30 mg/dL — AB
SPECIFIC GRAVITY, URINE: 1.029 (ref 1.005–1.030)

## 2017-12-31 LAB — HCG, QUANTITATIVE, PREGNANCY: hCG, Beta Chain, Quant, S: 77800 m[IU]/mL — ABNORMAL HIGH (ref <5)

## 2017-12-31 MED ORDER — PROMETHAZINE HCL 25 MG PO TABS: 25.0000 mg | ORAL_TABLET | Freq: Four times a day (QID) | ORAL | 0 refills | Status: DC | PRN

## 2017-12-31 NOTE — MAU Note (Signed)
Pt reports + UPT at urgent care on 3/4. Has had cramping for one month. Denies vaginal bleeding, discharge or irritation. Pt also has a cough, sore throat congestion and sneezing for one week. Was unsure of what meds she can take while pregnant.

## 2017-12-31 NOTE — Discharge Instructions (Signed)
[facility] ° ° °[facility]       Phone: [phone] ° °[facility] Phone: [phone] ° °[facility] [place]  Phone: [phone] ° °[facility] [place]  Phone: [phone] ° °[facility] [place]  Phone: [phone] ° °[facility]       Phone: [phone] ° °[facility]    Phone: [phone]  ° °[facility]    Phone: [phone] ° °[facility]    Phone: [phone] ° °[facility]    Phone: [phone]  ° °[facility]  Phone: [phone] ° °[facility]    Phone: [phone] ° °[facility]   Phone: [phone] ° °[facility]    Phone: [phone] ° ° ° ° ° °Morning Sickness °Morning sickness is when you feel sick to your stomach (nauseous) during pregnancy. This nauseous feeling may or may not come with vomiting. It often occurs in the morning but can be a problem any time of day. Morning sickness is most common during the first trimester, but it may continue throughout pregnancy. While morning sickness is unpleasant, it is usually harmless unless you develop severe and continual vomiting (hyperemesis gravidarum). This condition requires more intense treatment. °What are the causes? °The cause of morning sickness is not completely known but seems to be related to normal hormonal changes that occur in pregnancy. °What increases the risk? °You are at greater risk if you: °· Experienced nausea or vomiting before your pregnancy. °· Had morning sickness during a previous pregnancy. °· Are pregnant with more than one baby, such as twins. ° °How is this treated? °Do not use any medicines (prescription, over-the-counter, or herbal) for morning  sickness without first talking to your health care provider. Your health care provider may prescribe or recommend: °· Vitamin B6 supplements. °· Anti-nausea medicines. °· The herbal medicine ginger. ° °Follow these instructions at home: °· Only take over-the-counter or prescription medicines as directed by your health care provider. °· Taking multivitamins before getting pregnant can prevent or decrease the severity of morning sickness in most women. °· Eat a piece of dry toast or unsalted crackers before getting out of bed in the morning. °· Eat five or six small meals a day. °· Eat dry and bland foods (rice, baked potato). Foods high in carbohydrates are often helpful. °· Do not drink liquids with your meals. Drink liquids between meals. °· Avoid greasy, fatty, and spicy foods. °· Get someone to cook for you if the smell of any food causes nausea and vomiting. °· If you feel nauseous after taking prenatal vitamins, take the vitamins at night or with a snack. °· Snack on protein foods (nuts, yogurt, cheese) between meals if you are hungry. °· Eat unsweetened gelatins for desserts. °· Wearing an acupressure wristband (worn for sea sickness) may be helpful. °· Acupuncture may be helpful. °· Do not smoke. °· Get a humidifier to keep the air in your house free of odors. °· Get plenty of fresh air. °Contact a health care provider if: °· Your home remedies are not working, and you need medicine. °· You feel dizzy or lightheaded. °· You are losing weight. °Get help right away if: °· You have persistent and uncontrolled nausea and vomiting. °· You pass out (faint). °This information is not intended to replace   advice given to you by your health care provider. Make sure you discuss any questions you have with your health care provider. °Document Released: 12/01/2006 Document Revised: 03/17/2016 Document Reviewed: 03/27/2013 °Elsevier Interactive Patient Education © 2017 [employer] Inc. ° °

## 2017-12-31 NOTE — MAU Provider Note (Signed)
History     CSN: [id number]  Arrival date and time: 12/31/17 2440   First Provider Initiated Contact with Patient 12/31/17 2024      Chief Complaint  Patient presents with  . Abdominal Pain   HPI  Adrienne Maldonado is a 22 y.o. G1P0 at [redacted]w[redacted]d by LMP who presents with abdominal cramping. Symptoms began last month. Had a positive pregnancy test at urgent care last week & was told she would need to come to MAU to r/o ectopic due to pain. Reports constant lower abdominal cramping that she rates 8/10. Has not treated symptoms. Denies vaginal bleeding, vaginal discharge, diarrhea, constipation, or dysuria. Endorses daily nausea & vomiting. Vomits once every morning. Also reports symptoms of URI but hasn't taken anything d/t pregnancy. Endorses nonproductive cough, sore throat, rhinorrhea, congestion, and sneezing. Denies fever/chills, ear pain, SOB, or myalgias.   OB History    Gravida Para Term Preterm AB Living   1             SAB TAB Ectopic Multiple Live Births                  Past Medical History:  Diagnosis Date  . Asthma     Past Surgical History:  Procedure Laterality Date  . APPENDECTOMY      Family History  Problem Relation Age of Onset  . Diabetes Other   . Hypertension Other   . CAD Other     Social History   Tobacco Use  . Smoking status: Current Every Day Smoker    Packs/day: 0.25    Types: Cigarettes  . Smokeless tobacco: Never Used  Substance Use Topics  . Alcohol use: Yes  . Drug use: Yes    Types: Marijuana    Allergies:  Allergies  Allergen Reactions  . Concerta [Methylphenidate]     Unknown  . Ibuprofen     headache    Medications Prior to Admission  Medication Sig Dispense Refill Last Dose  . Prenatal Multivit-Min-Fe-FA (PRENATAL VITAMINS) 0.8 MG tablet Take 1 tablet by mouth daily. 30 tablet 0 12/31/2017 at Unknown time    Review of Systems  Constitutional: Negative.   HENT: Positive for congestion, sneezing and sore throat.    Respiratory: Positive for cough. Negative for shortness of breath.   Gastrointestinal: Positive for abdominal pain, nausea and vomiting. Negative for constipation and diarrhea.  Genitourinary: Negative.    Physical Exam   Blood pressure 128/71, pulse 88, temperature 99.1 F (37.3 C), temperature source Oral, resp. rate 19, last menstrual period 11/06/2017, SpO2 99 %.  Physical Exam  Nursing note and vitals reviewed. Constitutional: She is oriented to person, place, and time. She appears well-developed and well-nourished. No distress.  HENT:  Head: Normocephalic and atraumatic.  Eyes: Conjunctivae are normal. Right eye exhibits no discharge. Left eye exhibits no discharge. No scleral icterus.  Neck: Normal range of motion.  Cardiovascular: Normal rate, regular rhythm and normal heart sounds.  No murmur heard. Respiratory: Effort normal. No respiratory distress.  GI: Soft. She exhibits no distension. There is no tenderness. There is no rebound.  Neurological: She is alert and oriented to person, place, and time.  Skin: Skin is warm and dry. She is not diaphoretic.  Psychiatric: She has a normal mood and affect. Her behavior is normal. Judgment and thought content normal.    MAU Course  Procedures Results for orders placed or performed during the hospital encounter of 12/31/17 (from the past 24 hour(s))  Urinalysis,  Routine w reflex microscopic     Status: Abnormal   Collection Time: 12/31/17  8:05 PM  Result Value Ref Range   Color, Urine YELLOW YELLOW   APPearance HAZY (A) CLEAR   Specific Gravity, Urine 1.029 1.005 - 1.030   pH 5.0 5.0 - 8.0   Glucose, UA NEGATIVE NEGATIVE mg/dL   Hgb urine dipstick NEGATIVE NEGATIVE   Bilirubin Urine NEGATIVE NEGATIVE   Ketones, ur NEGATIVE NEGATIVE mg/dL   Protein, ur 30 (A) NEGATIVE mg/dL   Nitrite NEGATIVE NEGATIVE   Leukocytes, UA SMALL (A) NEGATIVE   RBC / HPF 0-5 0 - 5 RBC/hpf   WBC, UA 6-30 0 - 5 WBC/hpf   Bacteria, UA RARE (A)  NONE SEEN   Squamous Epithelial / LPF 6-30 (A) NONE SEEN   Mucus PRESENT   Wet prep, genital     Status: Abnormal   Collection Time: 12/31/17  8:40 PM  Result Value Ref Range   Yeast Wet Prep HPF POC NONE SEEN NONE SEEN   Trich, Wet Prep NONE SEEN NONE SEEN   Clue Cells Wet Prep HPF POC PRESENT (A) NONE SEEN   WBC, Wet Prep HPF POC MODERATE (A) NONE SEEN   Sperm NONE SEEN   CBC     Status: Abnormal   Collection Time: 12/31/17  8:47 PM  Result Value Ref Range   WBC 2.5 (L) 4.0 - 10.5 K/uL   RBC 4.13 3.87 - 5.11 MIL/uL   Hemoglobin 12.5 12.0 - 15.0 g/dL   HCT 47.835.8 (L) 29.536.0 - 62.146.0 %   MCV 86.7 78.0 - 100.0 fL   MCH 30.3 26.0 - 34.0 pg   MCHC 34.9 30.0 - 36.0 g/dL   RDW 30.812.9 65.711.5 - 84.615.5 %   Platelets 147 (L) 150 - 400 K/uL  ABO/Rh     Status: None   Collection Time: 12/31/17  8:47 PM  Result Value Ref Range   ABO/RH(D)      O POS Performed at [facility], [address] [place], [place] [id number]   hCG, quantitative, pregnancy     Status: Abnormal   Collection Time: 12/31/17  8:47 PM  Result Value Ref Range   hCG, Beta Chain, Quant, S 77,800 (H) <5 mIU/mL   [place] Ob Less Than 14 Weeks With Ob Transvaginal  Result Date: 12/31/2017 CLINICAL DATA:  Abdominal pain and first-trimester pregnancy EXAM: OBSTETRIC <14 WK US AND TRANSVAGINAL OB US TECHNIQUE: Both transabdominal and transvaginal ultrasound examinations were performed for complete evaluation of the gestation as well as the maternal uterus, adnexal regions, and pelvic cul-de-sac. Transvaginal technique was performed to assess early pregnancy. COMPARISON:  None. FINDINGS: Intrauterine gestational sac: Single Yolk sac:  Visualized. Embryo:  Visualized. Cardiac Activity: Visualized. Heart Rate: 154 bpm CRL:  25.8 mm   9 w   2 d                  US EDC: 08/03/2018 Subchorionic hemorrhage:  None visualized. Maternal uterus/adnexae: Physiologic appearance. IMPRESSION: Single living intrauterine pregnancy measuring 9 weeks 2 days. No  adverse finding. Electronically Signed   By: Marnee SpringJonathon  Watts M.D.   On: 12/31/2017 21:23     MDM +UPT UA, wet prep, GC/chlamydia, CBC, ABO/Rh, quant hCG, HIV, and US today to rule out ectopic pregnancy Ultrasound shows SIUP with cardiac activity measuring 1615w2d, EDD updated   Assessment and Plan  A: 1. Normal IUP (intrauterine pregnancy) on prenatal ultrasound, first trimester   2. Abdominal pain affecting pregnancy  3. [redacted] weeks gestation of pregnancy   4. Nausea and vomiting during pregnancy prior to [redacted] weeks gestation    P: Discharge home Rx phenergan Discussed reasons to return to MAU Start prenatal care GC/CT & urine culture pending   Judeth Horn 12/31/2017, 8:41 PM

## 2018-01-01 LAB — ABO/RH: ABO/RH(D): O POS

## 2018-01-01 LAB — GC/CHLAMYDIA PROBE AMP (~~LOC~~) NOT AT ARMC
Chlamydia: NEGATIVE
Neisseria Gonorrhea: NEGATIVE

## 2018-01-01 LAB — HIV ANTIBODY (ROUTINE TESTING W REFLEX): HIV Screen 4th Generation wRfx: NONREACTIVE

## 2018-01-02 LAB — CULTURE, OB URINE

## 2018-02-06 ENCOUNTER — Encounter (HOSPITAL_COMMUNITY): Payer: Self-pay

## 2018-02-06 ENCOUNTER — Inpatient Hospital Stay (HOSPITAL_COMMUNITY)
Admission: AD | Admit: 2018-02-06 | Discharge: 2018-02-06 | Disposition: A | Discharge status: Home / Self Care | Payer: Medicaid Other | Source: Ambulatory Visit | Attending: Obstetrics and Gynecology | Admitting: Obstetrics and Gynecology

## 2018-02-06 DIAGNOSIS — B9689 Other specified bacterial agents as the cause of diseases classified elsewhere: Secondary | ICD-10-CM | POA: Diagnosis not present

## 2018-02-06 DIAGNOSIS — N760 Acute vaginitis: Secondary | ICD-10-CM | POA: Diagnosis not present

## 2018-02-06 DIAGNOSIS — F17210 Nicotine dependence, cigarettes, uncomplicated: Secondary | ICD-10-CM | POA: Diagnosis not present

## 2018-02-06 DIAGNOSIS — R109 Unspecified abdominal pain: Secondary | ICD-10-CM | POA: Diagnosis present

## 2018-02-06 DIAGNOSIS — O9989 Other specified diseases and conditions complicating pregnancy, childbirth and the puerperium: Secondary | ICD-10-CM | POA: Diagnosis not present

## 2018-02-06 LAB — WET PREP, GENITAL
Sperm: NONE SEEN
TRICH WET PREP: NONE SEEN
YEAST WET PREP: NONE SEEN

## 2018-02-06 LAB — URINALYSIS, ROUTINE W REFLEX MICROSCOPIC
Bilirubin Urine: NEGATIVE
Glucose, UA: NEGATIVE mg/dL
Hgb urine dipstick: NEGATIVE
KETONES UR: 20 mg/dL — AB
Nitrite: NEGATIVE
PROTEIN: 30 mg/dL — AB
Specific Gravity, Urine: 1.032 — ABNORMAL HIGH (ref 1.005–1.030)
pH: 5 (ref 5.0–8.0)

## 2018-02-06 MED ORDER — METRONIDAZOLE 500 MG PO TABS: 500.0000 mg | ORAL_TABLET | Freq: Two times a day (BID) | ORAL | 0 refills | Status: AC

## 2018-02-06 NOTE — MAU Note (Signed)
PT SAYS  SHE  HAD VAG SPOTTING STARTED ON 4-12.   NONE  SINCE Sunday .  FEELS  BAD CRAMPS.   LAST SEX- JAN. [facility] -  MADE AN APPOINTMENT  WITH [facility]

## 2018-02-06 NOTE — Discharge Instructions (Signed)

## 2018-02-06 NOTE — MAU Provider Note (Signed)
Patient Adrienne Maldonado  G1P0 At 4652w4d here with spotting on Sunday one time when she wiped. She denies abnormal discharge; she has a document [facility]UP.  She denies  Burning with urination, nausea and vomiting, HA or other ob-gyn complaint.   History     CSN: [id number]  Arrival date and time: 02/06/18 1950   None     Chief Complaint  Patient presents with  . Abdominal Pain   Vaginal Bleeding  The patient's primary symptoms include vaginal bleeding. The patient's pertinent negatives include no genital itching, genital lesions or genital odor. This is a new problem. The current episode started in the past 7 days. The problem occurs rarely. The problem has been resolved. The pain is mild. Associated symptoms include abdominal pain. Pertinent negatives include no constipation, diarrhea, dysuria, fever, flank pain or headaches. The vaginal discharge was bloody. The vaginal bleeding is spotting. She has not been passing clots. She has not been passing tissue. Nothing aggravates the symptoms. She has tried nothing for the symptoms.   She says that she had some blood on her toilet paper on Sunday when she wiped. It was dark brown/mucous. She also has some cramping which she has had for two months. The cramping has not gotten better or worse, she feels like its a 2/10. She has not done anything for it.   OB History    Gravida  1   Para      Term      Preterm      AB      Living        SAB      TAB      Ectopic      Multiple      Live Births              Past Medical History:  Diagnosis Date  . Asthma     Past Surgical History:  Procedure Laterality Date  . APPENDECTOMY      Family History  Problem Relation Age of Onset  . Diabetes Other   . Hypertension Other   . CAD Other     Social History   Tobacco Use  . Smoking status: Current Every Day Smoker    Packs/day: 0.25    Types: Cigarettes  . Smokeless tobacco: Never Used  Substance Use Topics  . Alcohol  use: Yes  . Drug use: Yes    Types: Marijuana    Allergies:  Allergies  Allergen Reactions  . Bee Venom Anaphylaxis  . Concerta [Methylphenidate] Other (See Comments)    Unknown happened as a child  . Ibuprofen Other (See Comments)    headache    Medications Prior to Admission  Medication Sig Dispense Refill Last Dose  . acetaminophen (TYLENOL) 325 MG tablet Take 325 mg by mouth every 6 (six) hours as needed for moderate pain.   02/05/2018 at Unknown time  . Prenatal Multivit-Min-Fe-FA (PRENATAL VITAMINS) 0.8 MG tablet Take 1 tablet by mouth daily. 30 tablet 0 Past Week at Unknown time  . promethazine (PHENERGAN) 25 MG tablet Take 1 tablet (25 mg total) by mouth every 6 (six) hours as needed for nausea or vomiting. 30 tablet 0 Past Week at Unknown time    Review of Systems  Constitutional: Negative.  Negative for fever.  HENT: Negative.   Respiratory: Negative.   Cardiovascular: Negative.   Gastrointestinal: Positive for abdominal pain. Negative for constipation and diarrhea.  Genitourinary: Positive for vaginal bleeding. Negative for dysuria and  flank pain.  Musculoskeletal: Negative.   Neurological: Negative for headaches.   Physical Exam   Blood pressure 116/73, pulse 88, temperature 98.4 F (36.9 C), temperature source Oral, resp. rate 20, height 5' 6.5" (1.689 m), weight 203 lb 12 oz (92.4 kg), last menstrual period 11/06/2017.  Physical Exam  Constitutional: She appears well-developed.  HENT:  Head: Normocephalic.  Neck: Normal range of motion.  Respiratory: Effort normal.  GI: Soft.  Genitourinary:  Genitourinary Comments: NEFG; no blood or discharge in the vagina. Vaginal walls are pink with no lesions. No CMT, no suprapubic tenderness or adnexal tenderness or masses.   Musculoskeletal: Normal range of motion.  Neurological: She is alert.  Skin: Skin is warm and dry.    - MAU Course  Procedures  MDM -wet prep shows clue cells -gc chlamydia  pending -unofficial [place] at the beside shows active fetus with normal fluid and cardiac activity at 138 bpm. Patient cautioned that this was not a viability scan nor a scan to detect problems with baby or placenta; only to confirm heartbeat.   Assessment and Plan   1. Bacterial vaginosis    2. Patient stable for discharge with RX for Flagyl.   3. Patient has plans to make pre-natal appt; she has list of providers.   4. Bleeding and return precautions reviewed; patient verbalized understanding.   Adrienne Maldonado [name] 02/06/2018, 9:32 PM

## 2018-02-07 LAB — GC/CHLAMYDIA PROBE AMP (~~LOC~~) NOT AT ARMC
CHLAMYDIA, DNA PROBE: NEGATIVE
Neisseria Gonorrhea: NEGATIVE

## 2018-05-05 ENCOUNTER — Ambulatory Visit (HOSPITAL_COMMUNITY)
Admission: EM | Admit: 2018-05-05 | Discharge: 2018-05-05 | Disposition: A | Discharge status: Home / Self Care | Payer: Medicaid Other | Attending: Internal Medicine | Admitting: Internal Medicine

## 2018-05-05 ENCOUNTER — Encounter (HOSPITAL_COMMUNITY): Payer: Self-pay | Admitting: Emergency Medicine

## 2018-05-05 DIAGNOSIS — R0602 Shortness of breath: Secondary | ICD-10-CM

## 2018-05-05 DIAGNOSIS — Z331 Pregnant state, incidental: Secondary | ICD-10-CM | POA: Diagnosis not present

## 2018-05-05 DIAGNOSIS — J45909 Unspecified asthma, uncomplicated: Secondary | ICD-10-CM

## 2018-05-05 MED ORDER — ALBUTEROL SULFATE (2.5 MG/3ML) 0.083% IN NEBU
5.0000 mg | INHALATION_SOLUTION | Freq: Once | RESPIRATORY_TRACT | Status: AC
  Administered 2018-05-05: 5 mg via RESPIRATORY_TRACT

## 2018-05-05 MED ORDER — ALBUTEROL SULFATE (2.5 MG/3ML) 0.083% IN NEBU
INHALATION_SOLUTION | RESPIRATORY_TRACT | Status: AC
  Filled 2018-05-05: qty 6

## 2018-05-05 NOTE — ED Provider Notes (Signed)
[facility]    CSN: [id number] Arrival date & time: 05/05/18  1521     History   Chief Complaint Chief Complaint  Patient presents with  . Nasal Congestion    HPI Adrienne Maldonado is a 22 y.o. female.   22 y.o. female presents with SHOB X 1 day. Patient is [redacted] weeks pregnant. Patient states that she has an inhaler and has used it. Condition is acute in nature. Condition is made better by nothing. Condition is made worse by laying down and at night. Patient report relief with inhaler prior to there arrival at this facility.      Past Medical History:  Diagnosis Date  . Asthma     There are no active problems to display for this patient.   Past Surgical History:  Procedure Laterality Date  . APPENDECTOMY      OB History    Gravida  1   Para      Term      Preterm      AB      Living        SAB      TAB      Ectopic      Multiple      Live Births               Home Medications    Prior to Admission medications   Medication Sig Start Date End Date Taking? Authorizing Provider  acetaminophen (TYLENOL) 325 MG tablet Take 325 mg by mouth every 6 (six) hours as needed for moderate pain.    [provider]  Prenatal Multivit-Min-Fe-FA (PRENATAL VITAMINS) 0.8 MG tablet Take 1 tablet by mouth daily. 12/25/17   Domenick Gong, MD  promethazine (PHENERGAN) 25 MG tablet Take 1 tablet (25 mg total) by mouth every 6 (six) hours as needed for nausea or vomiting. 12/31/17   Judeth Horn, NP    Family History Family History  Problem Relation Age of Onset  . Diabetes Other   . Hypertension Other   . CAD Other     Social History Social History   Tobacco Use  . Smoking status: Current Every Day Smoker    Packs/day: 0.25    Types: Cigarettes  . Smokeless tobacco: Never Used  Substance Use Topics  . Alcohol use: Yes  . Drug use: Yes    Types: Marijuana     Allergies   Bee venom; Concerta [methylphenidate]; and  Ibuprofen   Review of Systems Review of Systems   Physical Exam Triage Vital Signs ED Triage Vitals [05/05/18 1604]  Enc Vitals Group     BP 117/78     Pulse Rate 68     Resp 16     Temp 98.1 F (36.7 C)     Temp src      SpO2 97 %     Weight      Height      Head Circumference      Peak Flow      Pain Score 0     Pain Loc      Pain Edu?      Excl. in GC?    No data found.  Updated Vital Signs BP 117/78   Pulse 68   Temp 98.1 F (36.7 C)   Resp 16   LMP 11/06/2017 (Approximate)   SpO2 97%   Visual Acuity Right Eye Distance:   Left Eye Distance:   Bilateral Distance:    Right  Eye Near:   Left Eye Near:    Bilateral Near:     Physical Exam   UC Treatments / Results  Labs (all labs ordered are listed, but only abnormal results are displayed) Labs Reviewed - No data to display  EKG None  Radiology No results found.  Procedures Procedures (including critical care time)  Medications Ordered in UC Medications  albuterol (PROVENTIL) (2.5 MG/3ML) 0.083% nebulizer solution 5 mg (5 mg Nebulization Given 05/05/18 1654)    Initial Impression / Assessment and Plan / UC Course  I have reviewed the triage vital signs and the nursing notes.  Pertinent labs & imaging results that were available during my care of the patient were reviewed by me and considered in my medical decision making (see chart for details).      Final Clinical Impressions(s) / UC Diagnoses   Final diagnoses:  None   Discharge Instructions   None    ED Prescriptions    None     Controlled Substance Prescriptions [place] Controlled Substance Registry consulted? Not Applicable   Alene MiresOmohundro, Jennifer C, NP 05/05/18 1721

## 2018-05-05 NOTE — ED Triage Notes (Signed)
Pt c/o cough and nasal congestion for the last few days. Pt is [redacted] weeks pregnant.

## 2018-06-25 ENCOUNTER — Inpatient Hospital Stay (HOSPITAL_COMMUNITY)
Admission: AD | Admit: 2018-06-25 | Discharge: 2018-06-25 | Disposition: A | Discharge status: Home / Self Care | Payer: Medicaid Other | Source: Ambulatory Visit | Attending: Obstetrics and Gynecology | Admitting: Obstetrics and Gynecology

## 2018-06-25 ENCOUNTER — Other Ambulatory Visit: Payer: Self-pay

## 2018-06-25 ENCOUNTER — Encounter (HOSPITAL_COMMUNITY): Payer: Self-pay | Admitting: *Deleted

## 2018-06-25 DIAGNOSIS — O26893 Other specified pregnancy related conditions, third trimester: Secondary | ICD-10-CM | POA: Diagnosis not present

## 2018-06-25 DIAGNOSIS — M7918 Myalgia, other site: Secondary | ICD-10-CM | POA: Insufficient documentation

## 2018-06-25 DIAGNOSIS — O99513 Diseases of the respiratory system complicating pregnancy, third trimester: Secondary | ICD-10-CM | POA: Diagnosis not present

## 2018-06-25 DIAGNOSIS — R1030 Lower abdominal pain, unspecified: Secondary | ICD-10-CM | POA: Diagnosis not present

## 2018-06-25 DIAGNOSIS — F17210 Nicotine dependence, cigarettes, uncomplicated: Secondary | ICD-10-CM | POA: Insufficient documentation

## 2018-06-25 DIAGNOSIS — M549 Dorsalgia, unspecified: Secondary | ICD-10-CM

## 2018-06-25 DIAGNOSIS — Z3A34 34 weeks gestation of pregnancy: Secondary | ICD-10-CM | POA: Diagnosis not present

## 2018-06-25 DIAGNOSIS — O99333 Smoking (tobacco) complicating pregnancy, third trimester: Secondary | ICD-10-CM | POA: Diagnosis not present

## 2018-06-25 DIAGNOSIS — Z886 Allergy status to analgesic agent status: Secondary | ICD-10-CM | POA: Insufficient documentation

## 2018-06-25 DIAGNOSIS — R51 Headache: Secondary | ICD-10-CM | POA: Diagnosis present

## 2018-06-25 DIAGNOSIS — R109 Unspecified abdominal pain: Secondary | ICD-10-CM | POA: Diagnosis present

## 2018-06-25 DIAGNOSIS — J45909 Unspecified asthma, uncomplicated: Secondary | ICD-10-CM | POA: Insufficient documentation

## 2018-06-25 DIAGNOSIS — Z79899 Other long term (current) drug therapy: Secondary | ICD-10-CM | POA: Diagnosis not present

## 2018-06-25 DIAGNOSIS — O9989 Other specified diseases and conditions complicating pregnancy, childbirth and the puerperium: Secondary | ICD-10-CM

## 2018-06-25 DIAGNOSIS — M545 Low back pain: Secondary | ICD-10-CM | POA: Insufficient documentation

## 2018-06-25 LAB — URINALYSIS, ROUTINE W REFLEX MICROSCOPIC
Bilirubin Urine: NEGATIVE
Glucose, UA: NEGATIVE mg/dL
Hgb urine dipstick: NEGATIVE
Ketones, ur: 5 mg/dL — AB
LEUKOCYTES UA: NEGATIVE
NITRITE: NEGATIVE
Protein, ur: NEGATIVE mg/dL
SPECIFIC GRAVITY, URINE: 1.02 (ref 1.005–1.030)
pH: 6 (ref 5.0–8.0)

## 2018-06-25 MED ORDER — CYCLOBENZAPRINE HCL 10 MG PO TABS
10.0000 mg | ORAL_TABLET | Freq: Once | ORAL | Status: AC
  Administered 2018-06-25: 10 mg via ORAL
  Filled 2018-06-25: qty 1

## 2018-06-25 NOTE — MAU Note (Signed)
Urine in lab 

## 2018-06-25 NOTE — MAU Provider Note (Signed)
Chief Complaint:  Abdominal Pain; Back Pain; and Headache   First Provider Initiated Contact with Patient 06/25/18 1115      HPI: Adrienne Maldonado is a 22 y.o. G1P0 at [redacted]w[redacted]d who presents to maternity admissions reporting constant back and abdominal pain x 2 days and intermittent frontal headache x 2 days. The abdominal pain is low in her abdomen and her low back and constant. It is unchanged in intensity since onset. She gardened with her grandmother on Saturday and picked up her nephew.  She was also out in the hot sun gardening the day symptoms started. She reports drinking plenty of fluids and has tried Tylenol for the pain which has not helped.  There are no other symptoms and she has not tried other treatments. She reports good fetal movement, denies LOF, vaginal bleeding, vaginal itching/burning, urinary symptoms, dizziness, n/v, or fever/chills.    HPI  Past Medical History: Past Medical History:  Diagnosis Date  . Asthma     Past obstetric history: OB History  Gravida Para Term Preterm AB Living  1            SAB TAB Ectopic Multiple Live Births               # Outcome Date GA Lbr Len/2nd Weight Sex Delivery Anes PTL Lv  1 Current             Past Surgical History: Past Surgical History:  Procedure Laterality Date  . APPENDECTOMY    . ROOT CANAL      Family History: Family History  Problem Relation Age of Onset  . Diabetes Other   . Hypertension Other   . CAD Other     Social History: Social History   Tobacco Use  . Smoking status: Current Every Day Smoker    Packs/day: 0.25    Types: Cigarettes  . Smokeless tobacco: Never Used  Substance Use Topics  . Alcohol use: Yes  . Drug use: Yes    Types: Marijuana    Allergies:  Allergies  Allergen Reactions  . Bee Venom Anaphylaxis  . Concerta [Methylphenidate] Other (See Comments)    Unknown happened as a child  . Ibuprofen Other (See Comments)    headache    Meds:  Medications Prior to Admission   Medication Sig Dispense Refill Last Dose  . acetaminophen (TYLENOL) 325 MG tablet Take 325 mg by mouth every 6 (six) hours as needed for moderate pain.   02/05/2018 at Unknown time  . Prenatal Multivit-Min-Fe-FA (PRENATAL VITAMINS) 0.8 MG tablet Take 1 tablet by mouth daily. 30 tablet 0 Past Week at Unknown time  . promethazine (PHENERGAN) 25 MG tablet Take 1 tablet (25 mg total) by mouth every 6 (six) hours as needed for nausea or vomiting. 30 tablet 0 Past Week at Unknown time    ROS:  Review of Systems  Constitutional: Negative for chills, fatigue and fever.  Eyes: Negative for visual disturbance.  Respiratory: Negative for shortness of breath.   Cardiovascular: Negative for chest pain.  Gastrointestinal: Positive for abdominal pain. Negative for nausea and vomiting.  Genitourinary: Positive for pelvic pain. Negative for difficulty urinating, dysuria, flank pain, vaginal bleeding, vaginal discharge and vaginal pain.  Musculoskeletal: Positive for back pain.  Neurological: Positive for headaches. Negative for dizziness.  Psychiatric/Behavioral: Negative.      I have reviewed patient's Past Medical Hx, Surgical Hx, Family Hx, Social Hx, medications and allergies.   Physical Exam   Patient Vitals for the past  24 hrs:  BP Temp Temp src Pulse Resp SpO2 Weight  06/25/18 1030 135/78 98.5 F (36.9 C) Oral 83 18 100 % 101.9 kg   Constitutional: Well-developed, well-nourished female in no acute distress.  Cardiovascular: normal rate Respiratory: normal effort GI: Abd soft, non-tender, gravid appropriate for gestational age.  MS: Extremities nontender, no edema, normal ROM Neurologic: Alert and oriented x 4.  GU: Neg CVAT.    Dilation: Closed Effacement (%): 50 Cervical Position: Posterior Presentation: Vertex Exam by:: Misty Stanley leftwich kirby CNM  FHT:  Baseline 130 , moderate variability, accelerations present, no decelerations Contractions: rare, mild to palpation    Labs: Results for orders placed or performed during the hospital encounter of 06/25/18 (from the past 24 hour(s))  Urinalysis, Routine w reflex microscopic     Status: Abnormal   Collection Time: 06/25/18 10:26 AM  Result Value Ref Range   Color, Urine YELLOW YELLOW   APPearance HAZY (A) CLEAR   Specific Gravity, Urine 1.020 1.005 - 1.030   pH 6.0 5.0 - 8.0   Glucose, UA NEGATIVE NEGATIVE mg/dL   Hgb urine dipstick NEGATIVE NEGATIVE   Bilirubin Urine NEGATIVE NEGATIVE   Ketones, ur 5 (A) NEGATIVE mg/dL   Protein, ur NEGATIVE NEGATIVE mg/dL   Nitrite NEGATIVE NEGATIVE   Leukocytes, UA NEGATIVE NEGATIVE   --/--/O POS Performed at [facility], [address], [place], [place] [address]  (03/10 2047)  Imaging:  No results found.  MAU Course/MDM: I have ordered labs and reviewed results.  NST reviewed and reactive Cervix closed and not contractions on toco, no evidence of preterm labor Pain c/w muskuloskeletal pain, headache may be from heat/mild dehydration Flexeril 10 mg PO dose in MAU Pt to go home, rest, increase PO fluids Keep appt at [facility] on Friday, return to MAU with signs of labor or emergencies Pt discharge with strict preterm labor precautions.  Today's evaluation included a work-up for preterm labor which can be life-threatening for both mom and baby.  Assessment: 1. Musculoskeletal pain   2. Back pain affecting pregnancy in third trimester   3. Headache in pregnancy, third trimester     Plan: Discharge home Labor precautions and fetal kick counts Follow-up Information    Department, [facility] Follow up.   Why:  As scheduled on 06/29/18, return to MAU as needed for emergencies Contact information: [address] [place] [address] [phone]          Allergies as of 06/25/2018      Reactions   Bee Venom Anaphylaxis   Concerta [methylphenidate] Other (See Comments)   Unknown happened as a child   Ibuprofen Other (See  Comments)   headache      Medication List    TAKE these medications   acetaminophen 325 MG tablet Commonly known as:  TYLENOL Take 325 mg by mouth every 6 (six) hours as needed for moderate pain.   Prenatal Vitamins 0.8 MG tablet Take 1 tablet by mouth daily.   promethazine 25 MG tablet Commonly known as:  PHENERGAN Take 1 tablet (25 mg total) by mouth every 6 (six) hours as needed for nausea or vomiting.       Sharen Counter Certified Nurse-Midwife 06/25/2018 11:31 AM

## 2018-06-25 NOTE — MAU Note (Signed)
Since yesterday morning has been having this really bad pain in her back and her stomach (constant).  Also been having a really bad headache, tried Tylenol, no relief.

## 2018-07-10 ENCOUNTER — Encounter (HOSPITAL_COMMUNITY): Payer: Self-pay

## 2018-07-10 ENCOUNTER — Inpatient Hospital Stay (HOSPITAL_COMMUNITY): Payer: Medicaid Other | Admitting: Anesthesiology

## 2018-07-10 ENCOUNTER — Encounter (HOSPITAL_COMMUNITY)
Admission: AD | Disposition: A | Discharge status: Home / Self Care | Payer: Self-pay | Source: Home / Self Care | Attending: Obstetrics and Gynecology

## 2018-07-10 ENCOUNTER — Inpatient Hospital Stay (HOSPITAL_COMMUNITY)
Admission: AD | Admit: 2018-07-10 | Discharge: 2018-07-13 | DRG: 788 | Disposition: A | Discharge status: Home / Self Care | Payer: Medicaid Other | Attending: Obstetrics and Gynecology | Admitting: Obstetrics and Gynecology

## 2018-07-10 DIAGNOSIS — O9952 Diseases of the respiratory system complicating childbirth: Secondary | ICD-10-CM | POA: Diagnosis present

## 2018-07-10 DIAGNOSIS — Z23 Encounter for immunization: Secondary | ICD-10-CM

## 2018-07-10 DIAGNOSIS — O321XX0 Maternal care for breech presentation, not applicable or unspecified: Secondary | ICD-10-CM | POA: Diagnosis present

## 2018-07-10 DIAGNOSIS — O99333 Smoking (tobacco) complicating pregnancy, third trimester: Secondary | ICD-10-CM

## 2018-07-10 DIAGNOSIS — O42913 Preterm premature rupture of membranes, unspecified as to length of time between rupture and onset of labor, third trimester: Secondary | ICD-10-CM | POA: Diagnosis present

## 2018-07-10 DIAGNOSIS — Z3A36 36 weeks gestation of pregnancy: Secondary | ICD-10-CM

## 2018-07-10 DIAGNOSIS — J45909 Unspecified asthma, uncomplicated: Secondary | ICD-10-CM | POA: Diagnosis present

## 2018-07-10 DIAGNOSIS — Z3403 Encounter for supervision of normal first pregnancy, third trimester: Secondary | ICD-10-CM

## 2018-07-10 DIAGNOSIS — F17210 Nicotine dependence, cigarettes, uncomplicated: Secondary | ICD-10-CM | POA: Diagnosis present

## 2018-07-10 DIAGNOSIS — O42919 Preterm premature rupture of membranes, unspecified as to length of time between rupture and onset of labor, unspecified trimester: Secondary | ICD-10-CM | POA: Diagnosis present

## 2018-07-10 DIAGNOSIS — O99334 Smoking (tobacco) complicating childbirth: Secondary | ICD-10-CM | POA: Diagnosis present

## 2018-07-10 LAB — CBC
HCT: 35 % — ABNORMAL LOW (ref 36.0–46.0)
Hemoglobin: 11.5 g/dL — ABNORMAL LOW (ref 12.0–15.0)
MCH: 28.4 pg (ref 26.0–34.0)
MCHC: 32.9 g/dL (ref 30.0–36.0)
MCV: 86.4 fL (ref 78.0–100.0)
Platelets: 136 10*3/uL — ABNORMAL LOW (ref 150–400)
RBC: 4.05 MIL/uL (ref 3.87–5.11)
RDW: 13.9 % (ref 11.5–15.5)
WBC: 4 10*3/uL (ref 4.0–10.5)

## 2018-07-10 LAB — TYPE AND SCREEN
ABO/RH(D): O POS
Antibody Screen: NEGATIVE

## 2018-07-10 LAB — RPR: RPR Ser Ql: NONREACTIVE

## 2018-07-10 LAB — POCT FERN TEST: POCT Fern Test: POSITIVE

## 2018-07-10 SURGERY — Surgical Case
Anesthesia: Spinal | Site: Abdomen | Wound class: Clean Contaminated

## 2018-07-10 MED ORDER — NALBUPHINE HCL 10 MG/ML IJ SOLN: 5.0000 mg | INTRAMUSCULAR | Status: DC | PRN

## 2018-07-10 MED ORDER — NALOXONE HCL 0.4 MG/ML IJ SOLN: 0.4000 mg | INTRAMUSCULAR | Status: DC | PRN

## 2018-07-10 MED ORDER — DIPHENHYDRAMINE HCL 25 MG PO CAPS: 25.0000 mg | ORAL_CAPSULE | Freq: Four times a day (QID) | ORAL | Status: DC | PRN

## 2018-07-10 MED ORDER — FENTANYL CITRATE (PF) 100 MCG/2ML IJ SOLN: 25.0000 ug | INTRAMUSCULAR | Status: DC | PRN

## 2018-07-10 MED ORDER — CEFAZOLIN SODIUM-DEXTROSE 2-4 GM/100ML-% IV SOLN
2.0000 g | INTRAVENOUS | Status: AC
  Administered 2018-07-10: 1 g via INTRAVENOUS
  Filled 2018-07-10: qty 100

## 2018-07-10 MED ORDER — ACETAMINOPHEN 325 MG PO TABS: 325.0000 mg | ORAL_TABLET | ORAL | Status: DC | PRN

## 2018-07-10 MED ORDER — FENTANYL CITRATE (PF) 100 MCG/2ML IJ SOLN
INTRAMUSCULAR | Status: AC
  Filled 2018-07-10: qty 2

## 2018-07-10 MED ORDER — PHENYLEPHRINE HCL 10 MG/ML IJ SOLN
INTRAMUSCULAR | Status: DC | PRN
  Administered 2018-07-10: 120 ug via INTRAVENOUS

## 2018-07-10 MED ORDER — OXYCODONE HCL 5 MG/5ML PO SOLN: 5.0000 mg | Freq: Once | ORAL | Status: DC | PRN

## 2018-07-10 MED ORDER — PNEUMOCOCCAL VAC POLYVALENT 25 MCG/0.5ML IJ INJ
0.5000 mL | INJECTION | INTRAMUSCULAR | Status: AC
  Administered 2018-07-12: 0.5 mL via INTRAMUSCULAR
  Filled 2018-07-10 (×2): qty 0.5

## 2018-07-10 MED ORDER — ONDANSETRON HCL 4 MG/2ML IJ SOLN: 4.0000 mg | Freq: Three times a day (TID) | INTRAMUSCULAR | Status: DC | PRN

## 2018-07-10 MED ORDER — SIMETHICONE 80 MG PO CHEW
80.0000 mg | CHEWABLE_TABLET | Freq: Three times a day (TID) | ORAL | Status: DC
  Administered 2018-07-10 – 2018-07-12 (×6): 80 mg via ORAL
  Filled 2018-07-10 (×7): qty 1

## 2018-07-10 MED ORDER — OXYCODONE HCL 5 MG PO TABS
10.0000 mg | ORAL_TABLET | Freq: Four times a day (QID) | ORAL | Status: DC | PRN
  Administered 2018-07-11 – 2018-07-13 (×4): 10 mg via ORAL
  Filled 2018-07-10 (×5): qty 2

## 2018-07-10 MED ORDER — SCOPOLAMINE 1 MG/3DAYS TD PT72
MEDICATED_PATCH | TRANSDERMAL | Status: DC | PRN
  Administered 2018-07-10: 1 via TRANSDERMAL

## 2018-07-10 MED ORDER — OXYCODONE HCL 5 MG PO TABS
5.0000 mg | ORAL_TABLET | Freq: Four times a day (QID) | ORAL | Status: DC | PRN
  Administered 2018-07-10 – 2018-07-13 (×5): 5 mg via ORAL
  Filled 2018-07-10 (×5): qty 1

## 2018-07-10 MED ORDER — SCOPOLAMINE 1 MG/3DAYS TD PT72: 1.0000 | MEDICATED_PATCH | Freq: Once | TRANSDERMAL | Status: DC

## 2018-07-10 MED ORDER — SODIUM CHLORIDE 0.9 % IR SOLN
Status: DC | PRN
  Administered 2018-07-10 (×2): 1

## 2018-07-10 MED ORDER — MORPHINE SULFATE (PF) 0.5 MG/ML IJ SOLN
INTRAMUSCULAR | Status: AC
  Filled 2018-07-10: qty 10

## 2018-07-10 MED ORDER — TETANUS-DIPHTH-ACELL PERTUSSIS 5-2.5-18.5 LF-MCG/0.5 IM SUSP: 0.5000 mL | Freq: Once | INTRAMUSCULAR | Status: DC

## 2018-07-10 MED ORDER — WITCH HAZEL-GLYCERIN EX PADS: 1.0000 "application " | MEDICATED_PAD | CUTANEOUS | Status: DC | PRN

## 2018-07-10 MED ORDER — MEPERIDINE HCL 25 MG/ML IJ SOLN: 6.2500 mg | INTRAMUSCULAR | Status: DC | PRN

## 2018-07-10 MED ORDER — OXYTOCIN 10 UNIT/ML IJ SOLN
INTRAVENOUS | Status: DC | PRN
  Administered 2018-07-10: 40 [IU] via INTRAVENOUS

## 2018-07-10 MED ORDER — DIPHENHYDRAMINE HCL 50 MG/ML IJ SOLN: 12.5000 mg | INTRAMUSCULAR | Status: DC | PRN

## 2018-07-10 MED ORDER — POLYETHYLENE GLYCOL 3350 17 G PO PACK
17.0000 g | PACK | Freq: Every day | ORAL | Status: DC
  Administered 2018-07-11 – 2018-07-13 (×3): 17 g via ORAL
  Filled 2018-07-10 (×4): qty 1

## 2018-07-10 MED ORDER — DEXAMETHASONE SODIUM PHOSPHATE 4 MG/ML IJ SOLN
INTRAMUSCULAR | Status: AC
  Filled 2018-07-10: qty 1

## 2018-07-10 MED ORDER — BUPIVACAINE IN DEXTROSE 0.75-8.25 % IT SOLN
INTRATHECAL | Status: DC | PRN
  Administered 2018-07-10: 1.6 mL via INTRATHECAL

## 2018-07-10 MED ORDER — NALOXONE HCL 4 MG/10ML IJ SOLN: 1.0000 ug/kg/h | INTRAVENOUS | Status: DC | PRN

## 2018-07-10 MED ORDER — BETAMETHASONE SOD PHOS & ACET 6 (3-3) MG/ML IJ SUSP
12.0000 mg | Freq: Once | INTRAMUSCULAR | Status: AC
  Administered 2018-07-10: 12 mg via INTRAMUSCULAR
  Filled 2018-07-10: qty 2

## 2018-07-10 MED ORDER — ACETAMINOPHEN 325 MG PO TABS
650.0000 mg | ORAL_TABLET | ORAL | Status: DC | PRN
  Administered 2018-07-10 – 2018-07-12 (×8): 650 mg via ORAL
  Filled 2018-07-10 (×8): qty 2

## 2018-07-10 MED ORDER — LACTATED RINGERS IV SOLN
INTRAVENOUS | Status: DC
  Administered 2018-07-10: 15:00:00 via INTRAVENOUS

## 2018-07-10 MED ORDER — SODIUM CHLORIDE 0.9% FLUSH: 3.0000 mL | INTRAVENOUS | Status: DC | PRN

## 2018-07-10 MED ORDER — PHENYLEPHRINE 8 MG IN D5W 100 ML (0.08MG/ML) PREMIX OPTIME
INJECTION | INTRAVENOUS | Status: AC
  Filled 2018-07-10: qty 100

## 2018-07-10 MED ORDER — SODIUM CHLORIDE 0.9 % IV SOLN
500.0000 mg | Freq: Once | INTRAVENOUS | Status: AC
  Administered 2018-07-10: 500 mg via INTRAVENOUS
  Filled 2018-07-10: qty 500

## 2018-07-10 MED ORDER — SOD CITRATE-CITRIC ACID 500-334 MG/5ML PO SOLN
30.0000 mL | ORAL | Status: AC
  Administered 2018-07-10: 30 mL via ORAL
  Filled 2018-07-10: qty 15

## 2018-07-10 MED ORDER — ONDANSETRON HCL 4 MG/2ML IJ SOLN
INTRAMUSCULAR | Status: DC | PRN
  Administered 2018-07-10: 4 mg via INTRAVENOUS

## 2018-07-10 MED ORDER — MORPHINE SULFATE (PF) 0.5 MG/ML IJ SOLN
INTRAMUSCULAR | Status: DC | PRN
  Administered 2018-07-10: 150 mg via INTRATHECAL

## 2018-07-10 MED ORDER — ONDANSETRON HCL 4 MG/2ML IJ SOLN: 4.0000 mg | Freq: Once | INTRAMUSCULAR | Status: DC | PRN

## 2018-07-10 MED ORDER — OXYTOCIN 10 UNIT/ML IJ SOLN
INTRAMUSCULAR | Status: AC
  Filled 2018-07-10: qty 4

## 2018-07-10 MED ORDER — FENTANYL CITRATE (PF) 100 MCG/2ML IJ SOLN
INTRAMUSCULAR | Status: DC | PRN
  Administered 2018-07-10: 15 ug via INTRATHECAL

## 2018-07-10 MED ORDER — ACETAMINOPHEN 160 MG/5ML PO SOLN: 325.0000 mg | ORAL | Status: DC | PRN

## 2018-07-10 MED ORDER — PRENATAL MULTIVITAMIN CH
1.0000 | ORAL_TABLET | Freq: Every day | ORAL | Status: DC
  Administered 2018-07-11 – 2018-07-13 (×3): 1 via ORAL
  Filled 2018-07-10 (×3): qty 1

## 2018-07-10 MED ORDER — NALBUPHINE HCL 10 MG/ML IJ SOLN
5.0000 mg | Freq: Once | INTRAMUSCULAR | Status: AC | PRN
  Filled 2018-07-10: qty 1

## 2018-07-10 MED ORDER — LACTATED RINGERS IV SOLN
INTRAVENOUS | Status: DC | PRN
  Administered 2018-07-10: 07:00:00 via INTRAVENOUS

## 2018-07-10 MED ORDER — LACTATED RINGERS IV SOLN
INTRAVENOUS | Status: DC | PRN
  Administered 2018-07-10: 06:00:00 via INTRAVENOUS

## 2018-07-10 MED ORDER — DIPHENHYDRAMINE HCL 25 MG PO CAPS
25.0000 mg | ORAL_CAPSULE | ORAL | Status: DC | PRN
  Administered 2018-07-10: 25 mg via ORAL
  Filled 2018-07-10: qty 1

## 2018-07-10 MED ORDER — KETOROLAC TROMETHAMINE 30 MG/ML IJ SOLN: 30.0000 mg | Freq: Four times a day (QID) | INTRAMUSCULAR | Status: AC | PRN

## 2018-07-10 MED ORDER — SCOPOLAMINE 1 MG/3DAYS TD PT72
MEDICATED_PATCH | TRANSDERMAL | Status: AC
  Filled 2018-07-10: qty 1

## 2018-07-10 MED ORDER — OXYCODONE HCL 5 MG PO TABS: 5.0000 mg | ORAL_TABLET | Freq: Once | ORAL | Status: DC | PRN

## 2018-07-10 MED ORDER — DIBUCAINE 1 % RE OINT: 1.0000 "application " | TOPICAL_OINTMENT | RECTAL | Status: DC | PRN

## 2018-07-10 MED ORDER — NALBUPHINE HCL 10 MG/ML IJ SOLN
5.0000 mg | Freq: Once | INTRAMUSCULAR | Status: AC | PRN
  Administered 2018-07-10: 5 mg via SUBCUTANEOUS

## 2018-07-10 MED ORDER — SENNOSIDES-DOCUSATE SODIUM 8.6-50 MG PO TABS
2.0000 | ORAL_TABLET | Freq: Every evening | ORAL | Status: DC | PRN
  Administered 2018-07-12: 2 via ORAL
  Filled 2018-07-10: qty 2

## 2018-07-10 MED ORDER — PHENYLEPHRINE 8 MG IN D5W 100 ML (0.08MG/ML) PREMIX OPTIME
INJECTION | INTRAVENOUS | Status: DC | PRN
  Administered 2018-07-10: 60 ug/min via INTRAVENOUS
  Administered 2018-07-10: 20 ug/min via INTRAVENOUS

## 2018-07-10 MED ORDER — COCONUT OIL OIL: 1.0000 "application " | TOPICAL_OIL | Status: DC | PRN

## 2018-07-10 MED ORDER — ONDANSETRON HCL 4 MG/2ML IJ SOLN
INTRAMUSCULAR | Status: AC
  Filled 2018-07-10: qty 2

## 2018-07-10 MED ORDER — INFLUENZA VAC SPLIT QUAD 0.5 ML IM SUSY
0.5000 mL | PREFILLED_SYRINGE | INTRAMUSCULAR | Status: AC
  Administered 2018-07-11: 0.5 mL via INTRAMUSCULAR

## 2018-07-10 MED ORDER — DEXAMETHASONE SODIUM PHOSPHATE 4 MG/ML IJ SOLN
INTRAMUSCULAR | Status: DC | PRN
  Administered 2018-07-10: 4 mg via INTRAVENOUS

## 2018-07-10 MED ORDER — OXYTOCIN 40 UNITS IN LACTATED RINGERS INFUSION - SIMPLE MED: 2.5000 [IU]/h | INTRAVENOUS | Status: AC

## 2018-07-10 MED ORDER — MENTHOL 3 MG MT LOZG: 1.0000 | LOZENGE | OROMUCOSAL | Status: DC | PRN

## 2018-07-10 MED ORDER — ENOXAPARIN SODIUM 60 MG/0.6ML ~~LOC~~ SOLN
50.0000 mg | SUBCUTANEOUS | Status: DC
  Administered 2018-07-11 – 2018-07-13 (×3): 50 mg via SUBCUTANEOUS
  Filled 2018-07-10 (×4): qty 0.6

## 2018-07-10 SURGICAL SUPPLY — 33 items
BENZOIN TINCTURE PRP APPL 2/3 (GAUZE/BANDAGES/DRESSINGS) ×3 IMPLANT
CANISTER SUCT 3000ML PPV (MISCELLANEOUS) ×3 IMPLANT
CHLORAPREP W/TINT 26ML (MISCELLANEOUS) ×3 IMPLANT
CLOSURE WOUND 1/2 X4 (GAUZE/BANDAGES/DRESSINGS) ×1
DRSG OPSITE POSTOP 4X10 (GAUZE/BANDAGES/DRESSINGS) ×3 IMPLANT
ELECT REM PT RETURN 9FT ADLT (ELECTROSURGICAL) ×3
ELECTRODE REM PT RTRN 9FT ADLT (ELECTROSURGICAL) ×1 IMPLANT
EXTRACTOR VACUUM KIWI (MISCELLANEOUS) ×3 IMPLANT
GLOVE BIOGEL PI IND STRL 7.0 (GLOVE) ×2 IMPLANT
GLOVE BIOGEL PI IND STRL 7.5 (GLOVE) ×1 IMPLANT
GLOVE BIOGEL PI INDICATOR 7.0 (GLOVE) ×4
GLOVE BIOGEL PI INDICATOR 7.5 (GLOVE) ×2
GLOVE SKINSENSE NS SZ7.0 (GLOVE) ×2
GLOVE SKINSENSE STRL SZ7.0 (GLOVE) ×1 IMPLANT
GOWN STRL REUS W/ TWL LRG LVL3 (GOWN DISPOSABLE) ×2 IMPLANT
GOWN STRL REUS W/ TWL XL LVL3 (GOWN DISPOSABLE) ×1 IMPLANT
GOWN STRL REUS W/TWL LRG LVL3 (GOWN DISPOSABLE) ×4
GOWN STRL REUS W/TWL XL LVL3 (GOWN DISPOSABLE) ×2
NS IRRIG 1000ML POUR BTL (IV SOLUTION) ×3 IMPLANT
PACK C SECTION WH (CUSTOM PROCEDURE TRAY) ×3 IMPLANT
PAD ABD 7.5X8 STRL (GAUZE/BANDAGES/DRESSINGS) ×3 IMPLANT
PAD OB MATERNITY 4.3X12.25 (PERSONAL CARE ITEMS) ×3 IMPLANT
PAD PREP 24X48 CUFFED NSTRL (MISCELLANEOUS) ×3 IMPLANT
PENCIL SMOKE EVAC W/HOLSTER (ELECTROSURGICAL) ×3 IMPLANT
STRIP CLOSURE SKIN 1/2X4 (GAUZE/BANDAGES/DRESSINGS) ×2 IMPLANT
SUT MNCRL 0 VIOLET CTX 36 (SUTURE) ×2 IMPLANT
SUT MON AB 4-0 PS1 27 (SUTURE) ×3 IMPLANT
SUT MONOCRYL 0 CTX 36 (SUTURE) ×4
SUT PLAIN 2 0 XLH (SUTURE) ×3 IMPLANT
SUT VIC AB 0 CT1 36 (SUTURE) ×6 IMPLANT
SUT VIC AB 3-0 CT1 27 (SUTURE) ×2
SUT VIC AB 3-0 CT1 TAPERPNT 27 (SUTURE) ×1 IMPLANT
TOWEL OR 17X24 6PK STRL BLUE (TOWEL DISPOSABLE) ×6 IMPLANT

## 2018-07-10 NOTE — Anesthesia Postprocedure Evaluation (Signed)
Anesthesia Post Note  Patient: Adrienne Maldonado  Procedure(s) Performed: CESAREAN SECTION (N/A Abdomen)     Patient location during evaluation: Mother Baby Anesthesia Type: Spinal Level of consciousness: awake and alert and oriented Pain management: satisfactory to patient Vital Signs Assessment: post-procedure vital signs reviewed and stable Respiratory status: spontaneous breathing and nonlabored ventilation Cardiovascular status: stable Postop Assessment: no headache, no backache, patient able to bend at knees, no signs of nausea or vomiting and adequate PO intake Anesthetic complications: no    Last Vitals:  Vitals:   07/10/18 1345 07/10/18 1429  BP:    Pulse:    Resp:    Temp:    SpO2: 95% 99%    Last Pain:  Vitals:   07/10/18 1532  TempSrc:   PainSc: 7    Pain Goal:                 Madison HickmanGREGORY,Vanessa Kampf

## 2018-07-10 NOTE — Op Note (Signed)
Operative Note   SURGERY DATE: 07/10/2018  PRE-OP DIAGNOSIS:  *Pregnancy at 36/4 *PPROM at 36/4 *Breech  POST-OP DIAGNOSIS: Same.    PROCEDURE: primary low transverse cesarean section via pfannenstiel skin incision with double layer uterine closure  SURGEON: Surgeon(s) and Role:    [place]* Kynzie Polgar, MD - Primary  ASSISTANT: None  ANESTHESIA: spinal  ESTIMATED BLOOD LOSS: 200 mL  DRAINS: 120mL UOP via indwelling foley  TOTAL IV FLUIDS: 1100mL crystalloid  VTE PROPHYLAXIS: SCDs to bilateral lower extremities  ANTIBIOTICS: Two grams of Cefazolin were given., within 1 hour of skin incision and azithromycin 500mg  IV given intra op  SPECIMENS: placenta to pathology  COMPLICATIONS: None  FINDINGS: No intra-abdominal adhesions were noted. Grossly normal uterus, tubes and ovaries. Clear amniotic fluid, complete breech female infant, weight 2575gm, APGARs 9/9, intact placenta.  PROCEDURE IN DETAIL: The patient was taken to the operating room where anesthesia was administered and normal fetal heart tones were confirmed. She was then prepped and draped in the normal fashion in the dorsal supine position with a leftward tilt.  After a time out was performed, a pfannensteil skin incision was made with the scalpel and carried through to the underlying layer of fascia. The fascia was then incised at the midline and this incision was extended laterally with the mayo scissors. Attention was turned to the superior aspect of the fascial incision which was grasped with the kocher clamps x 2, tented up and the rectus muscles were dissected off with the scalpel. In a similar fashion the inferior aspect of the fascial incision was grasped with the kocher clamps, tented up and the rectus muscles dissected off with the mayo scissors. The rectus muscles were then separated in the midline and the peritoneum was entered bluntly. The bladder blade was inserted and the vesicouterine peritoneum was identified,  tented up and entered with the metzenbaum scissors. This incision was extended laterally and the bladder flap was created digitally. The bladder blade was reinserted.  A low transverse hysterotomy was made with the scalpel until the endometrial cavity was breached and the amniotic sac ruptured, yielding clear amniotic fluid. This incision was extended bluntly and the infant's legs and hips were grasped and delivered through the hysterotomy; the rest of the body and head were then delivered atraumatically.The cord was clamped x 2 and cut, and the infant was handed to the awaiting pediatricians, after delayed cord clamping was done.  The placenta was then gradually expressed from the uterus and then the uterus was exteriorized and cleared of all clots and debris. The hysterotomy was repaired with a running suture of 1-0 . A second imbricating layer of 1-0  suture was then placed to achieve excellent hemostasis.   The uterus and adnexa were then returned to the abdomen, and the hysterotomy and all operative sites were reinspected and excellent hemostasis was noted after irrigation and suction of the abdomen with warm saline.  The peritoneum was closed with a running stitch of 3-0 Vicryl. The fascia was reapproximated with 0 Vicryl in a simple running fashion bilaterally. The subcutaneous layer was then reapproximated with interrupted sutures of 2-0 plain gut, and the skin was then closed with 4-0 monocryl, in a subcuticular fashion.  The patient  tolerated the procedure well. Sponge, lap, needle, and instrument counts were correct x 2. The patient was transferred to the recovery room awake, alert and breathing independently in stable condition.  Cornelia Copaharlie Brecken Dewoody, Jr. MD Attending Center for [facility](Faculty Practice)

## 2018-07-10 NOTE — Anesthesia Preprocedure Evaluation (Signed)
Anesthesia Evaluation  Patient identified by MRN, date of birth, ID band Patient awake    Reviewed: Allergy & Precautions, H&P , NPO status , Patient's Chart, lab work & pertinent test results, reviewed documented beta blocker date and time   Airway Mallampati: II  TM Distance: >3 FB Neck ROM: full    Dental no notable dental hx.    Pulmonary neg pulmonary ROS, Current Smoker,    Pulmonary exam normal breath sounds clear to auscultation       Cardiovascular negative cardio ROS Normal cardiovascular exam Rhythm:regular Rate:Normal     Neuro/Psych negative neurological ROS  negative psych ROS   GI/Hepatic negative GI ROS, Neg liver ROS,   Endo/Other  negative endocrine ROS  Renal/GU negative Renal ROS  negative genitourinary   Musculoskeletal   Abdominal   Peds  Hematology negative hematology ROS (+)   Anesthesia Other Findings   Reproductive/Obstetrics (+) Pregnancy                             Anesthesia Physical Anesthesia Plan  ASA: II and emergent  Anesthesia Plan: Spinal   Post-op Pain Management:    Induction:   PONV Risk Score and Plan: 1 and Scopolamine patch - Pre-op and Treatment may vary due to age or medical condition  Airway Management Planned: Nasal Cannula and Natural Airway  Additional Equipment:   Intra-op Plan:   Post-operative Plan:   Informed Consent: I have reviewed the patients History and Physical, chart, labs and discussed the procedure including the risks, benefits and alternatives for the proposed anesthesia with the patient or authorized representative who has indicated his/her understanding and acceptance.     Plan Discussed with: CRNA, Anesthesiologist and Surgeon  Anesthesia Plan Comments:         Anesthesia Quick Evaluation

## 2018-07-10 NOTE — H&P (Signed)
Nasreen Duignan is a 22 y.o. female presenting for SROM at 0330 today.  Dating is based on 9 week ultrasound. She receives prenatal care at [facility].  Pregnancy is complicated by the following:  Past Medical History:  Diagnosis Date  . Asthma      OB History    Gravida  1   Para      Term      Preterm      AB      Living        SAB      TAB      Ectopic      Multiple      Live Births             Past Medical History:  Diagnosis Date  . Asthma    Past Surgical History:  Procedure Laterality Date  . APPENDECTOMY    . ROOT CANAL     Family History: family history includes CAD in her other; Diabetes in her other; Hypertension in her other. Social History:  reports that she has been smoking cigarettes. She has been smoking about 0.25 packs per day. She has never used smokeless tobacco. She reports that she drinks alcohol. She reports that she has current or past drug history. Drug: Marijuana.     Maternal Diabetes: No Genetic Screening: Normal Maternal Ultrasounds/Referrals: Normal Fetal Ultrasounds or other Referrals:  None Maternal Substance Abuse:  Yes:  Type: Marijuana Significant Maternal Medications:  None Significant Maternal Lab Results:  None Other Comments:  GBS Unknown  Review of Systems  Constitutional: Negative for fever.  Respiratory: Negative for shortness of breath.   Gastrointestinal: Negative for abdominal pain, nausea and vomiting.  All other systems reviewed and are negative.  Maternal Medical History:  Reason for admission: Rupture of membranes.  Nausea.  Contractions: Onset was 1-2 hours ago.   Frequency: irregular.    Fetal activity: Perceived fetal activity is normal.   Last perceived fetal movement was within the past hour.        Blood pressure 134/85, pulse 79, temperature 98.7 F (37.1 C), resp. rate 18, height 5' 6.5" (1.689 m), weight 101.6 kg, last menstrual period 11/06/2017, SpO2 100  %. Maternal Exam:  Uterine Assessment: Contraction strength is mild.  Contraction frequency is irregular.   Abdomen: Patient reports no abdominal tenderness. Fetal presentation: breech  Introitus: Ferning test: positive.  Amniotic fluid character: clear.     Physical Exam  Nursing note and vitals reviewed. Constitutional: She is oriented to person, place, and time. She appears well-developed and well-nourished.  Cardiovascular: Normal rate, normal heart sounds and intact distal pulses.  Respiratory: Effort normal and breath sounds normal.  GI: There is no tenderness. There is no rebound.  Gravid  Genitourinary: Vagina normal and uterus normal.  Musculoskeletal: Normal range of motion.  Neurological: She is alert and oriented to person, place, and time. She has normal reflexes.  Skin: Skin is warm and dry.  Psychiatric: She has a normal mood and affect. Her behavior is normal. Judgment and thought content normal.    Prenatal labs: ABO, Rh: --/--/O POS Performed at [facility], [address] [place], [place] [address]  [phone]) Antibody:  NEG Rubella:   Immune RPR:   NR HBsAg:   NEG HIV: Non Reactive (03/10 2047)  GBS:   Unknown  Assessment/Plan: --22 y.o. G1P0 at [redacted]w[redacted]d  --Grossly ruptured at 0330, 1 cm by RN SVE in MAU --Category I tracing: baseline  135, moderate variability, positive accelerations, no decelerations --Breech position confirmed by bedside ultrasound performed by V. Aundria Rudogers, CNM --[facility] for breech presentation  Boy/both/outpatient circumcision/declines birth control  Calvert CantorSamantha C Weinhold, [place] 07/10/2018, 4:37 AM

## 2018-07-10 NOTE — Transfer of Care (Signed)
Immediate Anesthesia Transfer of Care Note  Patient: Adrienne Maldonado  Procedure(s) Performed: CESAREAN SECTION (N/A Abdomen)  Patient Location: PACU  Anesthesia Type:Spinal  Level of Consciousness: awake, alert  and oriented  Airway & Oxygen Therapy: Patient Spontanous Breathing  Post-op Assessment: Report given to RN and Post -op Vital signs reviewed and stable  Post vital signs: Reviewed and stable  Last Vitals:  Vitals Value Taken Time  BP    Temp    Pulse    Resp    SpO2      Last Pain:  Vitals:   07/10/18 0845  TempSrc:   PainSc: 0-No pain         Complications: No apparent anesthesia complications

## 2018-07-10 NOTE — Anesthesia Procedure Notes (Signed)
Spinal  Patient location during procedure: OR Start time: 07/10/2018 6:05 AM End time: 07/10/2018 6:10 AM Staffing Anesthesiologist: Bethena Midgetddono, Zariya Minner, MD Preanesthetic Checklist Completed: patient identified, site marked, surgical consent, pre-op evaluation, timeout performed, IV checked, risks and benefits discussed and monitors and equipment checked Spinal Block Patient position: sitting Prep: DuraPrep Patient monitoring: heart rate, cardiac monitor, continuous pulse ox and blood pressure Approach: midline Location: L4-5 Injection technique: single-shot Needle Needle type: Sprotte  Needle gauge: 24 G Needle length: 9 cm Assessment Sensory level: T4

## 2018-07-10 NOTE — Lactation Note (Signed)
This note was copied from a baby's chart. Lactation Consultation Note  Patient Name: Adrienne Maldonado YNWGN'FToday's Date: 07/10/2018 Reason for consult: Initial assessment;Infant < 6lbs;Late-preterm 34-36.6wks;Primapara Baby is 9212 hours old and has latched once to breast. Baby is also getting formula supplementation.  Breastfeeding consultation services and support information given and reviewed.  Caring For Your Late Preterm Baby handout also given.  Mom taught hand expression and colostrum easily expressed.  Baby placed skin to skin in football hold.  Baby latched off and on for 10 minutes but remained sleepy.  Instructed on breast massage during feeding.  Symphony pump set up and initiated.  Instructed to post pump every 3 hours.  Stressed importance of feeding and supplementing every 3 hours.  Encouraged to call for assist/concerns prn.  Maternal Data Has patient been taught Hand Expression?: Yes Does the patient have breastfeeding experience prior to this delivery?: No  Feeding Feeding Type: Breast Fed Length of feed: 10 min  LATCH Score Latch: Repeated attempts needed to sustain latch, nipple held in mouth throughout feeding, stimulation needed to elicit sucking reflex.  Audible Swallowing: None  Type of Nipple: Everted at rest and after stimulation  Comfort (Breast/Nipple): Soft / non-tender  Hold (Positioning): Assistance needed to correctly position infant at breast and maintain latch.  LATCH Score: 6  Interventions Interventions: Assisted with latch;Breast compression;Skin to skin;Adjust position;Breast massage;Support pillows;Hand express;Position options;DEBP  Lactation Tools Discussed/Used Pump Review: Setup, frequency, and cleaning Initiated by:: LM Date initiated:: 07/10/18   Consult Status Consult Status: Follow-up Date: 07/11/18 Follow-up type: In-patient    Huston FoleyMOULDEN, Arley Salamone S 07/10/2018, 6:55 PM

## 2018-07-10 NOTE — MAU Note (Signed)
Pt states water broke at 0330-clear fluid. Pt reports some cramping soon after. Reports that she has not felt baby move since yesterday morning. FHT in triage 130

## 2018-07-11 ENCOUNTER — Other Ambulatory Visit: Payer: Self-pay

## 2018-07-11 LAB — CREATININE, SERUM: Creatinine, Ser: 0.63 mg/dL (ref 0.44–1.00)

## 2018-07-11 LAB — GC/CHLAMYDIA PROBE AMP (~~LOC~~) NOT AT ARMC
Chlamydia: NEGATIVE
NEISSERIA GONORRHEA: NEGATIVE

## 2018-07-11 LAB — CBC
HEMATOCRIT: 30.7 % — AB (ref 36.0–46.0)
Hemoglobin: 10.4 g/dL — ABNORMAL LOW (ref 12.0–15.0)
MCH: 29.2 pg (ref 26.0–34.0)
MCHC: 33.9 g/dL (ref 30.0–36.0)
MCV: 86.2 fL (ref 78.0–100.0)
Platelets: 143 10*3/uL — ABNORMAL LOW (ref 150–400)
RBC: 3.56 MIL/uL — AB (ref 3.87–5.11)
RDW: 13.8 % (ref 11.5–15.5)
WBC: 8.1 10*3/uL (ref 4.0–10.5)

## 2018-07-11 NOTE — Progress Notes (Signed)
POSTPARTUM PROGRESS NOTE  Post Op Day 1 Subjective:  Adrienne Maldonado is a 22 y.o. G1P0101 4570w4d s/p pLTCS.  No acute events overnight.  Pt is doing well, denies problems with ambulating, voiding or po intake.  She denies nausea or vomiting. She has had flatus. She has not had bowel movement.  Lochia Moderate, x7 pads over 24h, not increasing.  Denies light-headedness, dizziness, HA, visual changes. Notes sharp RUQ pain primarily when abdomen is pressed on, and lower abdominal pain around incision site. Pain is moderately controlled w/ oxycodone and tylenol.  Objective: Blood pressure 127/74, pulse 63, temperature 98.4 F (36.9 C), temperature source Oral, resp. rate 18, height 5' 6.5" (1.689 m), weight 101.6 kg, last menstrual period 11/06/2017, SpO2 98 %, unknown if currently breastfeeding.  Physical Exam:  General: alert, cooperative and no distress Lochia:normal flow Chest: CTAB Heart: RRR no m/r/g Abdomen: +BS, soft, mildly ttp on RUQ and lower abdomen. Uterine Fundus: firm DVT Evaluation: No calf swelling or tenderness Extremities: No edema  Recent Labs    07/10/18 0458 07/11/18 0645  HGB 11.5* 10.4*  HCT 35.0* 30.7*   Lab Results  Component Value Date   CREATININE 0.63 07/11/2018   CREATININE 1.10 (H) 06/22/2017   CREATININE 0.83 05/30/2016    Assessment/Plan:  ASSESSMENT: Adrienne HamperShantiana Borghi is a 22 y.o. G1P0101 5570w4d s/p primary LTCS. She will receive post C/S lovenox today.   Breastfeeding, Lactation consult and Contraception declined    LOS: 1 day   Myrtie HawkAriel E Demitrius Crass, Medical Student 07/11/2018, 7:52 AM

## 2018-07-11 NOTE — Lactation Note (Signed)
This note was copied from a baby's chart. Lactation Consultation Note  Patient Name: Adrienne Baron HamperShantiana Fill ZOXWR'UToday's Date: 07/11/2018 Reason for consult: Follow-up assessment;Late-preterm 34-36.6wks;Infant < 6lbs;1st time breastfeeding;Primapara  P1 mother whose infant is now 832 hours old  Mother was holding baby who was underphototherpy as I arrived.    Mother has only bottle fed since last night at 1830.  Questioned mother as to her feeding plan and she stated that she will breastfeed baby since she knows it is "better for him."  She is not familiar with the LPTI care sheet so I reviewed it with her.  Discussed the formula supplementation guidelines for breastfeeding.  Encouraged her to call the next time baby shows cues so I can assist with latching.  Reminded her that if baby wants to supplement with more than the required amount to allow him to take as much as he wants.    She stated that he does show feeding cues and she will call for assistance as needed.  Offered to set up the DEBP and she accepted.  Pump parts, assembly, disassembly and cleaning reviewed.  Mother wanted to eat her lunch so flange size not checked.  However, she will call if she has any questions.  RN in room and updated.   Maternal Data Formula Feeding for Exclusion: No Has patient been taught Hand Expression?: Yes Does the patient have breastfeeding experience prior to this delivery?: No  Feeding    LATCH Score                   Interventions    Lactation Tools Discussed/Used Tools: Pump Pump Review: Setup, frequency, and cleaning;Milk Storage Initiated by:: Laureen OchsBeth Amaal Dimartino Date initiated:: 07/12/18   Consult Status Consult Status: Follow-up Date: 07/12/18 Follow-up type: In-patient    Zell Hylton R Lasalle Abee 07/11/2018, 3:25 PM

## 2018-07-11 NOTE — Progress Notes (Signed)
Subjective: Postpartum Day 1: Cesarean Delivery Patient reports incisional pain, tolerating PO and + flatus.    Objective: Vital signs in last 24 hours: Temp:  [97.2 F (36.2 C)-98.5 F (36.9 C)] 98.4 F (36.9 C) (09/18 16100633) Pulse Rate:  [52-68] 63 (09/18 0633) Resp:  [15-18] 18 (09/18 [id number]) BP: (109-130)/(61-88) 127/74 (09/18 54090633) SpO2:  [95 %-100 %] 98 % (09/18 [id number])  Physical Exam:  General: alert, cooperative and no distress Lochia: appropriate Uterine Fundus: firm Incision: healing well, no significant drainage, no dehiscence, no significant erythema DVT Evaluation: No evidence of DVT seen on physical exam.  Recent Labs    07/10/18 0458 07/11/18 0645  HGB 11.5* 10.4*  HCT 35.0* 30.7*    Assessment/Plan: Status post Cesarean section. Doing well postoperatively.  Continue current care.  Adrienne HeritageJacob J Deetta Maldonado 07/11/2018, 8:07 AM

## 2018-07-11 NOTE — Progress Notes (Signed)
Due to high hospital census and acuity, CSW is unable to meet with MOB to complete assessment for hx of marijuana use.   CSW notes that baby's UDS is negative and will monitor CDS result.  CSW will make report to Child Protective Services if warranted.  Please consult CSW if concerns arise or by MOB's request.  Blaine HamperAngel Boyd-Gilyard, MSW, LCSW Clinical Social Work [phone]

## 2018-07-12 LAB — CULTURE, OB URINE: Culture: NO GROWTH

## 2018-07-12 LAB — CULTURE, BETA STREP (GROUP B ONLY)

## 2018-07-12 NOTE — Lactation Note (Signed)
This note was copied from a baby's chart. Lactation Consultation Note  Patient Name: Adrienne Baron HamperShantiana Mogel ONGEX'BToday's Date: 07/12/2018 Reason for consult: Follow-up assessment;Late-preterm 34-36.6wks;1st time breastfeeding;Primapara  p1 mother whose infant is now 5954 hours old  Baby is under photottherapy.  Per mom, baby will get another lab in the a.m. And hopefully be discharged tomorrow.  Mother has not attempted latching baby to breast yet.  When asked if she was pumping every 3 hours mother stated, "I'm trying to."  Reminded her about the importance of pumping every 3 hours for stimulation to help increase milk supply.  I am uncertain as to her dedication to breast feeding and/or pumping for this baby.    Discussed volumes of supplementation to be at least 20-30 mls after every feeding and more if he desires.  Mother verbalized understanding.   Continue to encourage pumping every 3 hours, hand expression post pumping and to save any EBM she obtains to feed back to baby.  Asked mother to call for latch assistance as needed.  Visitor in room at the present time.  Mother has no questions/concerns related to her LPTI or feeding.     Maternal Data Formula Feeding for Exclusion: No Has patient been taught Hand Expression?: Yes Does the patient have breastfeeding experience prior to this delivery?: No  Feeding Feeding Type: Formula  LATCH Score                   Interventions    Lactation Tools Discussed/Used WIC Program: Yes Initiated by:: Set up yesterday Date initiated:: 07/11/18   Consult Status Consult Status: Follow-up Date: 07/13/18 Follow-up type: In-patient    Navah Grondin R Gladstone Rosas 07/12/2018, 12:42 PM

## 2018-07-12 NOTE — Progress Notes (Addendum)
Post Partum Day 2  Subjective:  Baron HamperShantiana Upshaw is a 22 y.o. G1P0101 6069w4d s/p pLTCS.  No acute events overnight.  Pt denies problems with ambulating, voiding or po intake.  She denies nausea or vomiting.  Pain is well controlled.  She has had flatus. She has not had bowel movement.  Lochia Small.  Plan for birth control is undecided.  Method of Feeding: breast and bottle  Objective: BP 125/76 (BP Location: Right Arm)   Pulse 60   Temp 98.8 F (37.1 C) (Oral)   Resp (!) 24   Ht 5' 6.5" (1.689 m)   Wt 101.6 kg   LMP 11/06/2017 (Approximate)   SpO2 98%   Breastfeeding? Unknown   BMI 35.61 kg/m   Physical Exam:  General: alert, cooperative and no distress Lochia:normal flow Chest: normal work of breathing Heart: regular rate Abdomen: soft, nontender Uterine Fundus: firm Incision: Dressing is clean, dry, and intact DVT Evaluation: No evidence of DVT seen on physical exam.   Recent Labs    07/10/18 0458 07/11/18 0645  HGB 11.5* 10.4*  HCT 35.0* 30.7*    Assessment/Plan:  ASSESSMENT: Baron HamperShantiana Welby is a 22 y.o. G1P0101 6769w4d ppd #2 s/p pLTCS doing well.   Plan for discharge tomorrow and Lactation consult Continue routine PP care   LOS: 2 days   Denzil HughesKelsey D Ash, DO 07/12/2018, 2:49 PM   Midwife attestation Post Partum Day 1 I have seen and examined this patient and agree with above documentation in the resident's note.   Leonides SchanzShantiana Dutch Quintoole is a 22 y.o. G1P0101 s/p PCS.  Pt denies problems with ambulating, voiding or po intake. Pain is well controlled. Method of Feeding: bottle  PE:  Gen: well appearing Heart: reg rate Lungs: normal WOB Fundus firm Ext: soft, no pain, no edema  Assessment: S/p CS for breech POD #2  Plan for discharge: tomorrow  Donette LarryMelanie Marirose Deveney, CNM 3:50 PM

## 2018-07-13 MED ORDER — OXYCODONE-ACETAMINOPHEN 5-325 MG PO TABS: 1.0000 | ORAL_TABLET | ORAL | 0 refills | Status: AC | PRN

## 2018-07-13 NOTE — Lactation Note (Signed)
This note was copied from a baby's chart. Lactation Consultation Note  Patient Name: Boy Baron HamperShantiana Burdin RUEAV'WToday's Date: 07/13/2018   Visited with P1 Mom of 3863w4d baby on day of probable discharge.  Baby 77 hrs old.    Double phototherapy dc'd this am, waiting on serum bilirubin.   After speaking with Mom [facility] pump loaner [facility], Mom stated she didn't really want to breastfeed.  Mom knows she can call Lactation Office prn.   Judee ClaraSmith, Karalee Hauter E 07/13/2018, 12:26 PM

## 2018-07-13 NOTE — Discharge Summary (Addendum)
OB Discharge Summary     Patient Name: Adrienne Maldonado DOB: 07/21/1996 MRN: [id number]  Date of admission: 07/10/2018 Delivering MD: [place] Bing   Date of discharge: 07/13/2018  Admitting diagnosis: 36.4WKS WATER BROKE Intrauterine pregnancy: [redacted]w[redacted]d     Secondary diagnosis:  Active Problems:   Encounter for supervision of normal first pregnancy in third trimester   Asthma   Tobacco smoking affecting pregnancy in third trimester   Preterm premature rupture of membranes  Additional problems: none     Discharge diagnosis: Term Pregnancy Delivered                                                                                                Post partum procedures:none  Augmentation: none  Complications: None  Hospital course:  Onset of Labor With Unplanned C/S  22 y.o. yo G1P0101 at [redacted]w[redacted]d was admitted in Latent Labor on 07/10/2018. Patient had a labor course significant for breech presentation. Membrane Rupture Time/Date: 3:30 AM ,07/10/2018   The patient went for cesarean section due to Malpresentation, and delivered a Viable infant,07/10/2018  Details of operation can be found in separate operative note. Patient had an uncomplicated postpartum course.  She is ambulating,tolerating a regular diet, passing flatus, and urinating well.  Patient is discharged home in stable condition 07/13/18.  Physical exam  Vitals:   07/12/18 0905 07/12/18 1712 07/12/18 2124 07/13/18 0549  BP: 125/76 135/83 134/78 131/78  Pulse: 60 80 75 68  Resp: (!) 24 18 16 18   Temp: 98.8 F (37.1 C)  99.2 F (37.3 C) 98.9 F (37.2 C)  TempSrc: Oral  Oral Oral  SpO2: 98% 99% 99% 99%  Weight:      Height:       General: alert, cooperative and no distress Lochia: appropriate Uterine Fundus: firm Incision: Dressing is clean, dry, and intact DVT Evaluation: No evidence of DVT seen on physical exam. Labs: Lab Results  Component Value Date   WBC 8.1 07/11/2018   HGB 10.4 (L) 07/11/2018   HCT 30.7  (L) 07/11/2018   MCV 86.2 07/11/2018   PLT 143 (L) 07/11/2018   CMP Latest Ref Rng & Units 07/11/2018  Glucose 65 - 99 mg/dL -  BUN 6 - 20 mg/dL -  Creatinine 4.09 - 8.11 mg/dL 9.14  Sodium 782 - 956 mmol/L -  Potassium 3.5 - 5.1 mmol/L -  Chloride 101 - 111 mmol/L -  CO2 22 - 32 mmol/L -  Calcium 8.9 - 10.3 mg/dL -    Discharge instruction: per After Visit Summary and "Baby and Me Booklet".  After visit meds:  Allergies as of 07/13/2018      Reactions   Bee Venom Anaphylaxis   Concerta [methylphenidate] Other (See Comments)   Unknown happened as a child   Ibuprofen Other (See Comments)   headache      Medication List    STOP taking these medications   promethazine 25 MG tablet Commonly known as:  PHENERGAN     TAKE these medications   acetaminophen 500 MG tablet Commonly known as:  TYLENOL Take 1,000 mg by mouth  every 8 (eight) hours as needed for headache.   albuterol 108 (90 Base) MCG/ACT inhaler Commonly known as:  PROVENTIL HFA;VENTOLIN HFA Inhale 2 puffs into the lungs every 6 (six) hours as needed for wheezing or shortness of breath.   oxyCODONE-acetaminophen 5-325 MG tablet Commonly known as:  PERCOCET/ROXICET Take 1 tablet by mouth every 4 (four) hours as needed for severe pain.   Prenatal Vitamins 0.8 MG tablet Take 1 tablet by mouth daily.       Diet: routine diet  Activity: Advance as tolerated. Pelvic rest for 6 weeks.   Outpatient follow up:2 weeks Follow up Appt:No future appointments. Follow up Visit:No follow-ups on file.  Postpartum contraception: None- same sex partner  Newborn Data: Live born female  Birth Weight: 5 lb 10.8 oz (2575 g) APGAR: 9, 9  Newborn Delivery   Birth date/time:  07/10/2018 06:33:00 Delivery type:  C-Section, Low Transverse Trial of labor:  No C-section categorization:  Primary     Baby Feeding: Bottle and Breast Disposition:home with mother   07/13/2018 Denzil HughesKelsey D Ash, MD

## 2018-07-13 NOTE — Discharge Instructions (Signed)

## 2018-12-02 IMAGING — US US OB < 14 WEEKS - US OB TV
1 series · 15 of 28 positions shown · non-contrast
Comparison: None.

CLINICAL DATA: Abdominal pain and first-trimester pregnancy

EXAM:
OBSTETRIC <14 WK US AND TRANSVAGINAL OB US
TECHNIQUE: Both transabdominal and transvaginal ultrasound examinations were
performed for complete evaluation of the gestation as well as the
maternal uterus, adnexal regions, and pelvic cul-de-sac.
Transvaginal technique was performed to assess early pregnancy.

[Series 1: us ob < 14 weeks - us ob tv · 15 of 63 slices shown]
[im 1/63]
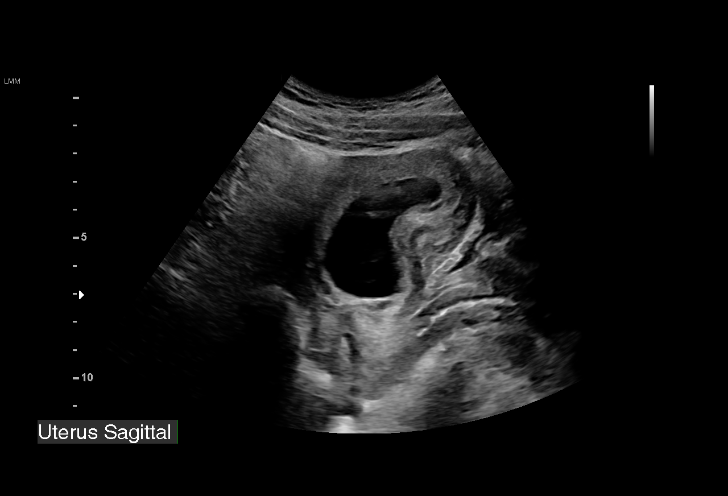
[im 5/63]
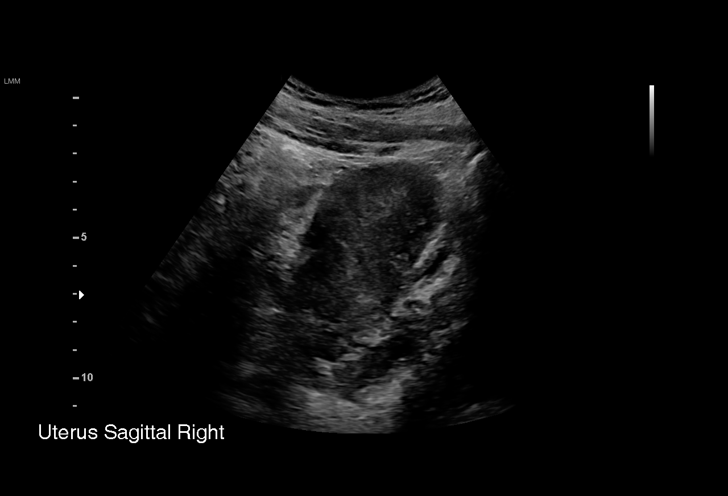
[im 10/63]
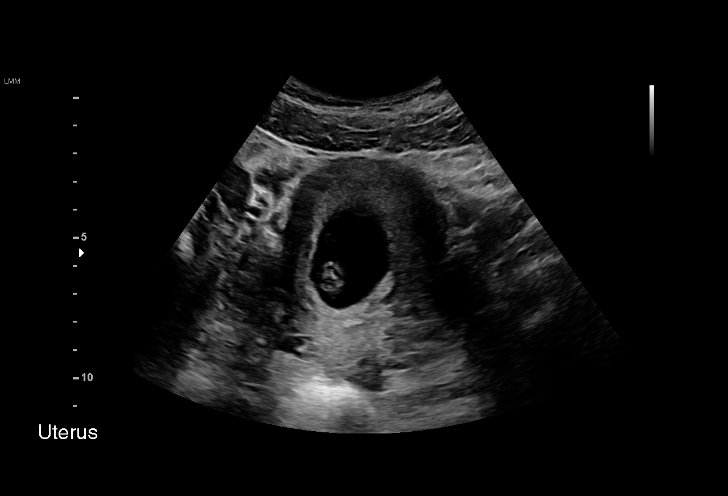
[im 14/63]
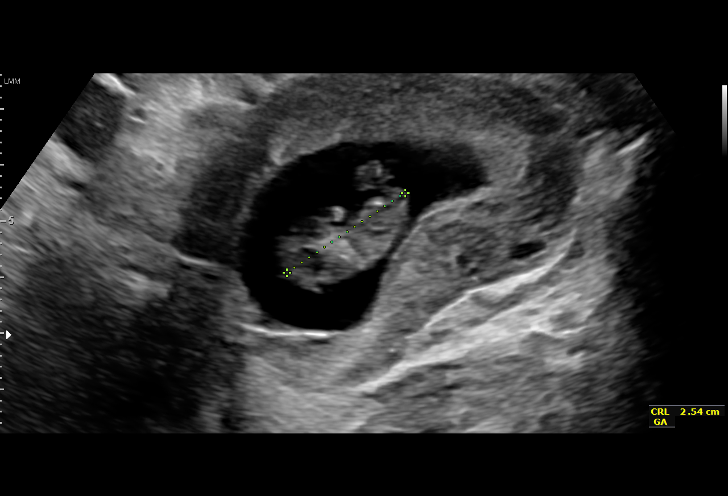
[im 19/63]
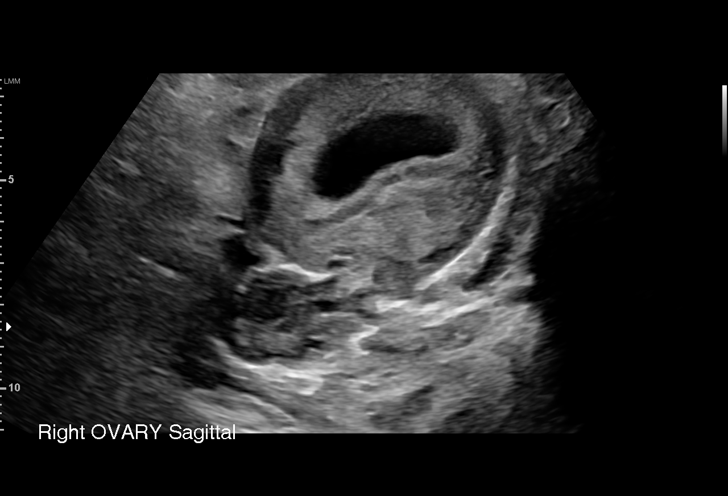
[im 23/63]
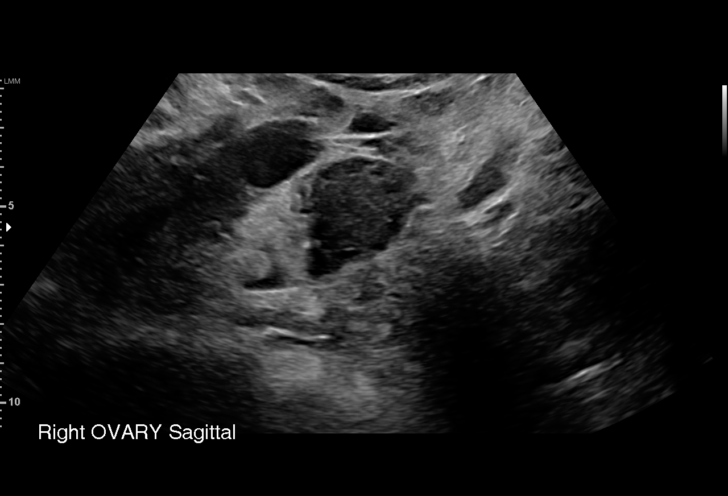
[im 28/63]
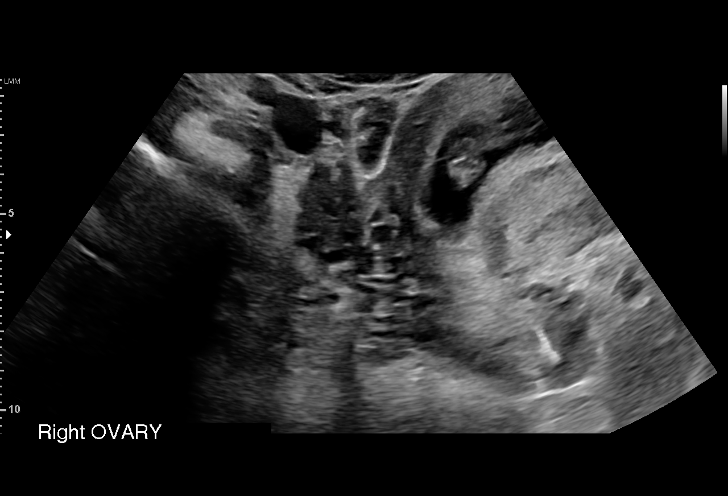
[im 33/63]
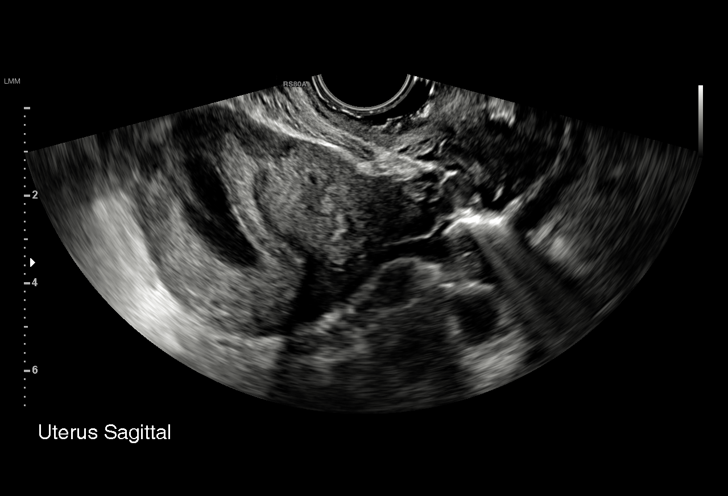
[im 35/63]
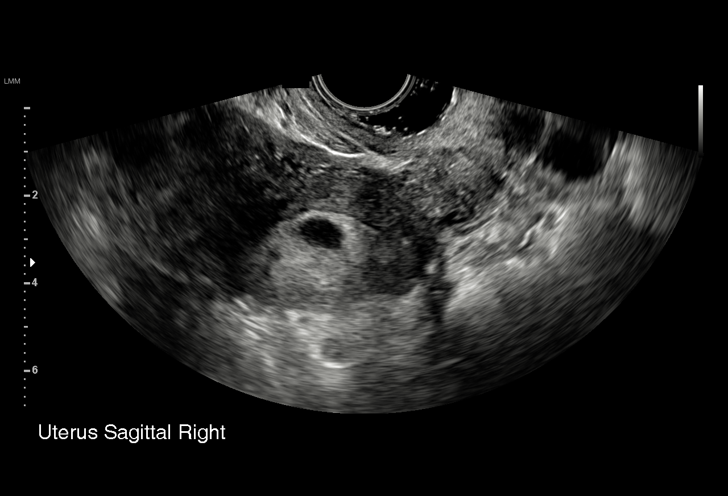
[im 40/63]
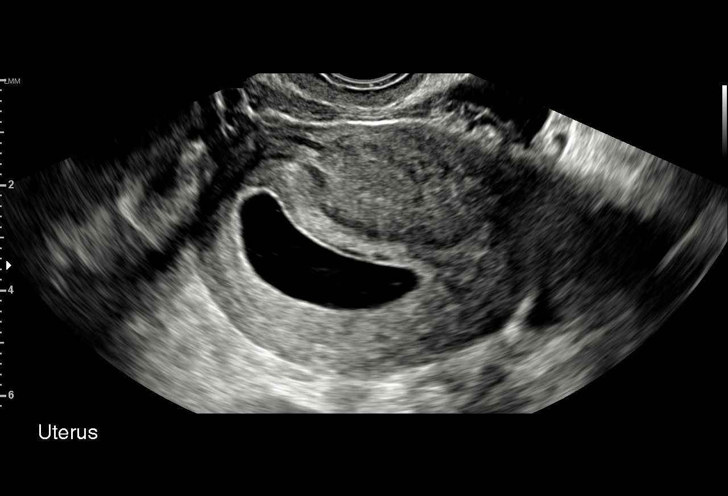
[im 44/63]
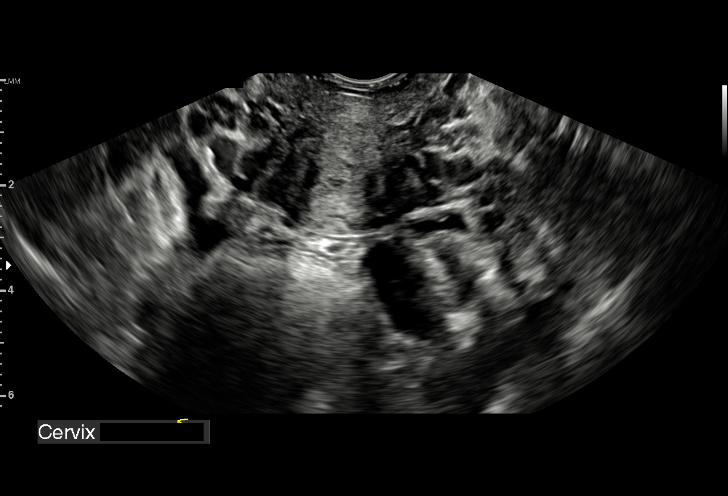
[im 49/63]
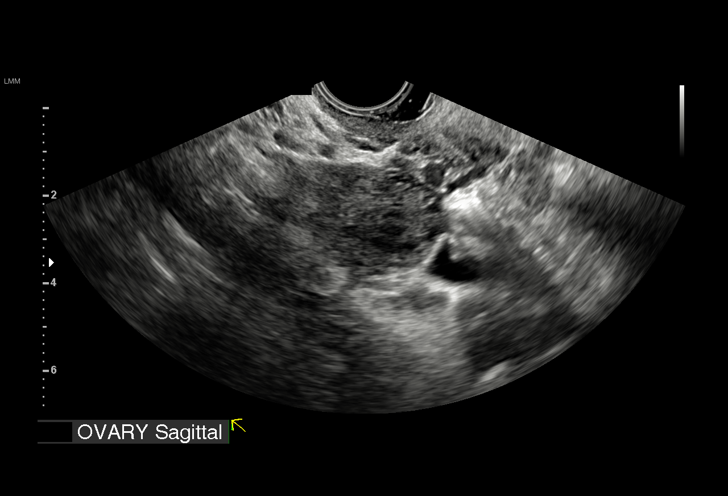
[im 53/63]
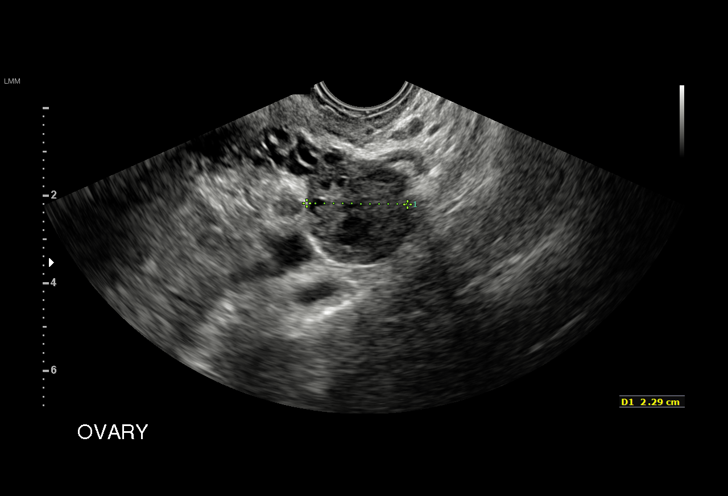
[im 58/63]
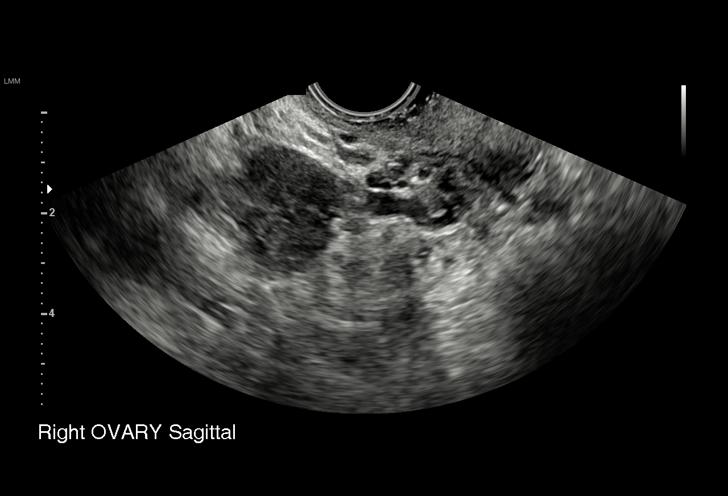
[im 63/63]
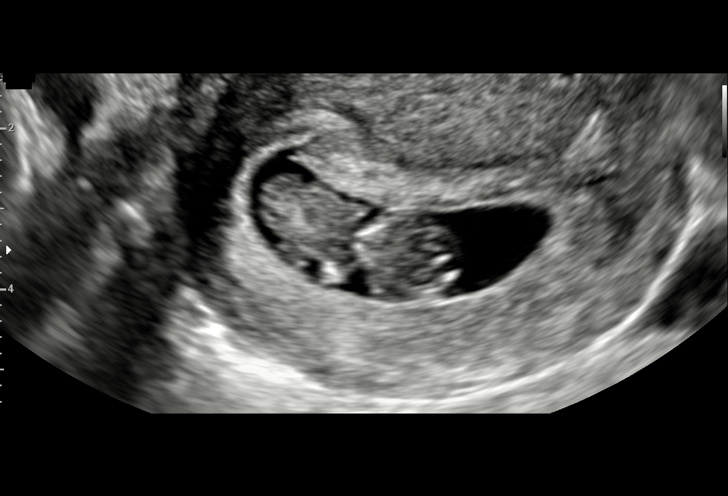

[15 of 28 positions shown; findings below may reference images not displayed]

FINDINGS: Intrauterine gestational sac: Single

Yolk sac:  Visualized.

Embryo:  Visualized.

Cardiac Activity: Visualized.

Heart Rate: 154 bpm

CRL:  25.8 mm   9 w   2 d                  US EDC: 08/03/2018

Subchorionic hemorrhage:  None visualized.

Maternal uterus/adnexae: Physiologic appearance.
IMPRESSION: Single living intrauterine pregnancy measuring 9 weeks 2 days. No
adverse finding.

## 2020-01-09 ENCOUNTER — Other Ambulatory Visit: Payer: Self-pay

## 2020-01-09 ENCOUNTER — Telehealth: Payer: Self-pay | Admitting: Student

## 2020-01-09 ENCOUNTER — Inpatient Hospital Stay (HOSPITAL_COMMUNITY)
Admission: AD | Admit: 2020-01-09 | Discharge: 2020-01-09 | Disposition: A | Discharge status: Home / Self Care | Payer: Medicaid Other | Attending: Obstetrics & Gynecology | Admitting: Obstetrics & Gynecology

## 2020-01-09 DIAGNOSIS — N939 Abnormal uterine and vaginal bleeding, unspecified: Secondary | ICD-10-CM | POA: Diagnosis present

## 2020-01-09 DIAGNOSIS — Z3202 Encounter for pregnancy test, result negative: Secondary | ICD-10-CM | POA: Insufficient documentation

## 2020-01-09 LAB — HCG, QUANTITATIVE, PREGNANCY: hCG, Beta Chain, Quant, S: <1 m[IU]/mL (ref <5)

## 2020-01-09 LAB — POCT PREGNANCY, URINE: Preg Test, Ur: NEGATIVE

## 2020-01-09 NOTE — MAU Note (Cosign Needed)
Patient Adrienne Maldonado is a 24 y.o. G1P0101  at Unknown GA here with complaints of vaginal bleeding. She states that she was told by [facility] on [address] on Tuesday (two days ago), that she was pregnant. She sought care at [facility] for a UTI. The medicine and Azo has been turning her urine orange, and she sees orange on her toilet paper when she wipes. This started last night.  She is not sure if it is the Azo or vaginal bleeding. She endorses some light cramping on her right side and wonders if her period could be starting. She is not using contraception.   UPT is negative in MAU; patient will be discharged home with quant bHCG pending. Patient knows we will call her and she agrees to come back to MAU if she needs ectopic work-up.   Luna Kitchens

## 2020-01-09 NOTE — MAU Note (Signed)
Is bleeding, started last night.  Is currently on antibiotics for a UTI, is also taking AZO, unsure if actually is blood or discolored from meds. (urine is bright orange). Was told at [facility] a couple days ago that she was preg.

## 2020-01-09 NOTE — Telephone Encounter (Signed)
Called patient Adrienne Maldonado to inform her that beta HCG is less than 1, signifying that she is not pregnant. Patient will continue her UTI treatment as prescribed.   Luna Kitchens

## 2020-02-08 ENCOUNTER — Other Ambulatory Visit: Payer: Self-pay

## 2020-02-08 ENCOUNTER — Emergency Department (HOSPITAL_COMMUNITY)
Admission: EM | Admit: 2020-02-08 | Discharge: 2020-02-08 | Disposition: A | Discharge status: Other Acute Inpatient Hospital | Payer: Medicaid Other | Attending: Emergency Medicine | Admitting: Emergency Medicine

## 2020-02-08 ENCOUNTER — Inpatient Hospital Stay (HOSPITAL_COMMUNITY)
Admission: AD | Admit: 2020-02-08 | Discharge: 2020-02-11 | DRG: 885 | Disposition: A | Discharge status: Home / Self Care | Payer: Medicaid Other | Source: Intra-hospital | Attending: Psychiatry | Admitting: Psychiatry

## 2020-02-08 ENCOUNTER — Encounter (HOSPITAL_COMMUNITY): Payer: Self-pay | Admitting: Emergency Medicine

## 2020-02-08 DIAGNOSIS — F1490 Cocaine use, unspecified, uncomplicated: Secondary | ICD-10-CM | POA: Diagnosis present

## 2020-02-08 DIAGNOSIS — I10 Essential (primary) hypertension: Secondary | ICD-10-CM | POA: Diagnosis present

## 2020-02-08 DIAGNOSIS — J45909 Unspecified asthma, uncomplicated: Secondary | ICD-10-CM | POA: Insufficient documentation

## 2020-02-08 DIAGNOSIS — F332 Major depressive disorder, recurrent severe without psychotic features: Secondary | ICD-10-CM | POA: Insufficient documentation

## 2020-02-08 DIAGNOSIS — F1220 Cannabis dependence, uncomplicated: Secondary | ICD-10-CM | POA: Insufficient documentation

## 2020-02-08 DIAGNOSIS — Z886 Allergy status to analgesic agent status: Secondary | ICD-10-CM

## 2020-02-08 DIAGNOSIS — R45851 Suicidal ideations: Secondary | ICD-10-CM

## 2020-02-08 DIAGNOSIS — Z20822 Contact with and (suspected) exposure to covid-19: Secondary | ICD-10-CM | POA: Insufficient documentation

## 2020-02-08 DIAGNOSIS — Z8249 Family history of ischemic heart disease and other diseases of the circulatory system: Secondary | ICD-10-CM

## 2020-02-08 DIAGNOSIS — Z23 Encounter for immunization: Secondary | ICD-10-CM

## 2020-02-08 DIAGNOSIS — Z560 Unemployment, unspecified: Secondary | ICD-10-CM | POA: Diagnosis not present

## 2020-02-08 DIAGNOSIS — R3915 Urgency of urination: Secondary | ICD-10-CM | POA: Diagnosis present

## 2020-02-08 DIAGNOSIS — F1290 Cannabis use, unspecified, uncomplicated: Secondary | ICD-10-CM | POA: Diagnosis present

## 2020-02-08 DIAGNOSIS — F17210 Nicotine dependence, cigarettes, uncomplicated: Secondary | ICD-10-CM | POA: Diagnosis present

## 2020-02-08 DIAGNOSIS — Z590 Homelessness: Secondary | ICD-10-CM

## 2020-02-08 DIAGNOSIS — G4700 Insomnia, unspecified: Secondary | ICD-10-CM | POA: Diagnosis present

## 2020-02-08 DIAGNOSIS — Z833 Family history of diabetes mellitus: Secondary | ICD-10-CM

## 2020-02-08 DIAGNOSIS — F1010 Alcohol abuse, uncomplicated: Secondary | ICD-10-CM | POA: Insufficient documentation

## 2020-02-08 DIAGNOSIS — F1420 Cocaine dependence, uncomplicated: Secondary | ICD-10-CM | POA: Diagnosis not present

## 2020-02-08 DIAGNOSIS — Z915 Personal history of self-harm: Secondary | ICD-10-CM | POA: Diagnosis not present

## 2020-02-08 DIAGNOSIS — F12288 Cannabis dependence with other cannabis-induced disorder: Secondary | ICD-10-CM | POA: Diagnosis not present

## 2020-02-08 DIAGNOSIS — F1410 Cocaine abuse, uncomplicated: Secondary | ICD-10-CM | POA: Diagnosis not present

## 2020-02-08 LAB — ETHANOL: Alcohol, Ethyl (B): <10 mg/dL (ref <10)

## 2020-02-08 LAB — SALICYLATE LEVEL: Salicylate Lvl: <7 mg/dL — ABNORMAL LOW (ref 7.0–30.0)

## 2020-02-08 LAB — RAPID URINE DRUG SCREEN, HOSP PERFORMED
Amphetamines: NOT DETECTED
Barbiturates: NOT DETECTED
Benzodiazepines: NOT DETECTED
Cocaine: POSITIVE — AB
Opiates: NOT DETECTED
Tetrahydrocannabinol: POSITIVE — AB

## 2020-02-08 LAB — CBC
HCT: 43.1 % (ref 36.0–46.0)
Hemoglobin: 13.6 g/dL (ref 12.0–15.0)
MCH: 28.9 pg (ref 26.0–34.0)
MCHC: 31.6 g/dL (ref 30.0–36.0)
MCV: 91.7 fL (ref 80.0–100.0)
Platelets: 215 10*3/uL (ref 150–400)
RBC: 4.7 MIL/uL (ref 3.87–5.11)
RDW: 13.2 % (ref 11.5–15.5)
WBC: 6.1 10*3/uL (ref 4.0–10.5)
nRBC: 0 % (ref 0.0–0.2)

## 2020-02-08 LAB — COMPREHENSIVE METABOLIC PANEL
ALT: 30 U/L (ref 0–44)
AST: 20 U/L (ref 15–41)
Albumin: 4.1 g/dL (ref 3.5–5.0)
Alkaline Phosphatase: 47 U/L (ref 38–126)
Anion gap: 8 (ref 5–15)
BUN: 7 mg/dL (ref 6–20)
CO2: 26 mmol/L (ref 22–32)
Calcium: 9.2 mg/dL (ref 8.9–10.3)
Chloride: 105 mmol/L (ref 98–111)
Creatinine, Ser: 0.89 mg/dL (ref 0.44–1.00)
GFR calc Af Amer: >60 mL/min (ref >60)
GFR calc non Af Amer: >60 mL/min (ref >60)
Glucose, Bld: 88 mg/dL (ref 70–99)
Potassium: 3.9 mmol/L (ref 3.5–5.1)
Sodium: 139 mmol/L (ref 135–145)
Total Bilirubin: 0.6 mg/dL (ref 0.3–1.2)
Total Protein: 6.9 g/dL (ref 6.5–8.1)

## 2020-02-08 LAB — RESPIRATORY PANEL BY RT PCR (FLU A&B, COVID)
Influenza A by PCR: NEGATIVE
Influenza B by PCR: NEGATIVE
SARS Coronavirus 2 by RT PCR: NEGATIVE

## 2020-02-08 LAB — I-STAT BETA HCG BLOOD, ED (MC, WL, AP ONLY): I-stat hCG, quantitative: <5 m[IU]/mL (ref <5)

## 2020-02-08 LAB — ACETAMINOPHEN LEVEL: Acetaminophen (Tylenol), Serum: <10 ug/mL — ABNORMAL LOW (ref 10–30)

## 2020-02-08 MED ORDER — MAGNESIUM HYDROXIDE 400 MG/5ML PO SUSP: 30.0000 mL | Freq: Every day | ORAL | Status: DC | PRN

## 2020-02-08 MED ORDER — HYDROXYZINE HCL 25 MG PO TABS: 25.0000 mg | ORAL_TABLET | Freq: Three times a day (TID) | ORAL | Status: DC | PRN

## 2020-02-08 MED ORDER — ALUM & MAG HYDROXIDE-SIMETH 200-200-20 MG/5ML PO SUSP: 30.0000 mL | ORAL | Status: DC | PRN

## 2020-02-08 MED ORDER — MELATONIN 5 MG PO TABS
5.0000 mg | ORAL_TABLET | Freq: Every day | ORAL | Status: DC
  Filled 2020-02-08: qty 1

## 2020-02-08 MED ORDER — TRAZODONE HCL 50 MG PO TABS
50.0000 mg | ORAL_TABLET | Freq: Every evening | ORAL | Status: DC | PRN
  Administered 2020-02-09 – 2020-02-10 (×3): 50 mg via ORAL
  Filled 2020-02-08 (×3): qty 1

## 2020-02-08 MED ORDER — MELATONIN 5 MG PO TABS: 5.0000 mg | ORAL_TABLET | Freq: Every day | ORAL | Status: DC

## 2020-02-08 MED ORDER — ACETAMINOPHEN 325 MG PO TABS
650.0000 mg | ORAL_TABLET | Freq: Four times a day (QID) | ORAL | Status: DC | PRN
  Administered 2020-02-10: 650 mg via ORAL
  Filled 2020-02-08: qty 2

## 2020-02-08 MED ORDER — MELATONIN 5 MG PO TABS
5.0000 mg | ORAL_TABLET | Freq: Every day | ORAL | Status: DC
  Filled 2020-02-08 (×2): qty 1

## 2020-02-08 NOTE — ED Notes (Signed)
Pt transported with Volanda Napoleon from [employer]. Anderson given 1 pt belonging bag, 1 sealed security envelope with pt valuables and envelope with all documentation. Pt ambulated unassisted to transport. At time of departure pt VSS and A+Ox4.

## 2020-02-08 NOTE — ED Notes (Signed)
TTS done 

## 2020-02-08 NOTE — ED Notes (Signed)
Pt changed into hospital-provided scrubs and belongings placed in labeled bags. Pt valuables given to security to be locked away. Pt belongings inventoried and placed in [facility] locker #3. Pt wanded by security and pt is resting at this time.

## 2020-02-08 NOTE — ED Provider Notes (Signed)
[facility] EMERGENCY DEPARTMENT Provider Note   CSN: [id number] Arrival date & time: 02/08/20  1707     History Chief Complaint  Patient presents with  . Suicidal    Adrienne Maldonado is a 24 y.o. female.  HPI Patient presents to the emergency department with suicidal ideations and cutting herself today.  The patient states that this has been building over some time.  The patient states she feels like she is had some postpartum depression.  Patient states that she does use cocaine as well.  She states she does not drink significant bouts of alcohol.  Patient denies any hallucinations.  The patient states that she has attempted suicide in the past as well.  The patient does not take any psychiatric medications on a regular basis.  The patient denies chest pain, shortness of breath, headache,blurred vision, neck pain, fever, cough, weakness, numbness, dizziness, anorexia, edema, abdominal pain, nausea, vomiting, diarrhea, rash, back pain, dysuria, hematemesis, bloody stool, near syncope, or syncope.    Past Medical History:  Diagnosis Date  . Asthma     Patient Active Problem List   Diagnosis Date Noted  . Encounter for supervision of normal first pregnancy in third trimester 07/10/2018  . Asthma 07/10/2018  . Tobacco smoking affecting pregnancy in third trimester 07/10/2018  . Preterm premature rupture of membranes 07/10/2018    Past Surgical History:  Procedure Laterality Date  . APPENDECTOMY    . CESAREAN SECTION N/A 07/10/2018   Procedure: CESAREAN SECTION;  Surgeon: [place] Bing, MD;  Location: [facility] BIRTHING SUITES;  Service: Obstetrics;  Laterality: N/A;  . ROOT CANAL       OB History    Gravida  1   Para  1   Term      Preterm  1   AB      Living  1     SAB      TAB      Ectopic      Multiple  0   Live Births  1           Family History  Problem Relation Age of Onset  . Diabetes Other   . Hypertension Other   . CAD Other      Social History   Tobacco Use  . Smoking status: Current Every Day Smoker    Packs/day: 0.25    Types: Cigarettes  . Smokeless tobacco: Never Used  Substance Use Topics  . Alcohol use: Yes  . Drug use: Yes    Types: Marijuana    Home Medications Prior to Admission medications   Medication Sig Start Date End Date Taking? Authorizing Provider  acetaminophen (TYLENOL) 500 MG tablet Take 1,000 mg by mouth every 8 (eight) hours as needed for headache.    [provider]  albuterol (PROVENTIL HFA;VENTOLIN HFA) 108 (90 Base) MCG/ACT inhaler Inhale 2 puffs into the lungs every 6 (six) hours as needed for wheezing or shortness of breath.     [provider]  Prenatal Multivit-Min-Fe-FA (PRENATAL VITAMINS) 0.8 MG tablet Take 1 tablet by mouth daily. 12/25/17   Domenick Gong, MD    Allergies    Bee venom, Concerta [methylphenidate], and Ibuprofen  Review of Systems   Review of Systems All other systems negative except as documented in the HPI. All pertinent positives and negatives as reviewed in the HPI. Physical Exam Updated Vital Signs BP (!) 139/97 (BP Location: Right Arm)   Pulse 93   Temp 99 F (37.2  C) (Oral)   Resp 20   LMP 02/01/2020   SpO2 100%   Physical Exam Vitals and nursing note reviewed.  Constitutional:      General: She is not in acute distress.    Appearance: She is well-developed.  HENT:     Head: Normocephalic and atraumatic.  Eyes:     Pupils: Pupils are equal, round, and reactive to light.  Cardiovascular:     Rate and Rhythm: Normal rate and regular rhythm.     Heart sounds: Normal heart sounds. No murmur. No friction rub. No gallop.   Pulmonary:     Effort: Pulmonary effort is normal. No respiratory distress.     Breath sounds: Normal breath sounds. No wheezing.  Musculoskeletal:     Cervical back: Normal range of motion and neck supple.  Skin:    General: Skin is warm and dry.     Capillary Refill: Capillary refill takes  less than 2 seconds.     Findings: No erythema or rash.  Neurological:     Mental Status: She is alert and oriented to person, place, and time.     Motor: No abnormal muscle tone.     Coordination: Coordination normal.  Psychiatric:        Mood and Affect: Mood normal.        Behavior: Behavior normal.        Thought Content: Thought content is not paranoid or delusional. Thought content includes suicidal ideation. Thought content does not include homicidal ideation. Thought content includes suicidal plan. Thought content does not include homicidal plan.     ED Results / Procedures / Treatments   Labs (all labs ordered are listed, but only abnormal results are displayed) Labs Reviewed  COMPREHENSIVE METABOLIC PANEL  ETHANOL  SALICYLATE LEVEL  ACETAMINOPHEN LEVEL  CBC  RAPID URINE DRUG SCREEN, HOSP PERFORMED  I-STAT BETA HCG BLOOD, ED ([facility], [facility], [facility] ONLY)    EKG None  Radiology No results found.  Procedures Procedures (including critical care time)  Medications Ordered in ED Medications - No data to display  ED Course  I have reviewed the triage vital signs and the nursing notes.  Pertinent labs & imaging results that were available during my care of the patient were reviewed by me and considered in my medical decision making (see chart for details).    MDM Rules/Calculators/A&P                      Patient will need TTS assessment for her suicidal plans and ideations.  Patient is made aware of the plan and all questions were answered thus far.  The patient is stable and agrees this plan. Final Clinical Impression(s) / ED Diagnoses Final diagnoses:  None    Rx / DC Orders ED Discharge Orders    None       Dalia Heading, PA-C 02/08/20 1952    Wyvonnia Dusky, MD 02/09/20 [phone]

## 2020-02-08 NOTE — ED Notes (Signed)
Received call from [facility] stateing they will accept patient.

## 2020-02-08 NOTE — BH Assessment (Signed)
[facility] Assessment Progress Note   Clinician informed [facility] Randa Evens that patient had a negative COVID test.  She said that patient can come to [facility] 301-1 to Dr. Jama Flavors.  Clinician informed Ebbie Ridge, PA.   Clinician spoke to nurse Will and informed him of patient being accepted to [facility]  Requested that patient's voluntary admission papers be faxed to [facility] at [phone]

## 2020-02-08 NOTE — ED Notes (Signed)
Gave report to [employer] at [facility].

## 2020-02-08 NOTE — ED Notes (Signed)
Pt endorses post partum depression, hearing voices, cutting and substance abuse.
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
## 2020-02-08 NOTE — ED Triage Notes (Signed)
Pt states she is suicidal.  Superficial lacerations to L forearm and R thigh.  States she is always suicidal but this was the first time she acted on thoughts in the last 3 yrs.

## 2020-02-09 ENCOUNTER — Encounter (HOSPITAL_COMMUNITY): Payer: Self-pay | Admitting: Nurse Practitioner

## 2020-02-09 DIAGNOSIS — F12288 Cannabis dependence with other cannabis-induced disorder: Secondary | ICD-10-CM

## 2020-02-09 DIAGNOSIS — F1410 Cocaine abuse, uncomplicated: Secondary | ICD-10-CM

## 2020-02-09 LAB — URINALYSIS, COMPLETE (UACMP) WITH MICROSCOPIC
Bilirubin Urine: NEGATIVE
Glucose, UA: NEGATIVE mg/dL
Hgb urine dipstick: NEGATIVE
Ketones, ur: 5 mg/dL — AB
Nitrite: NEGATIVE
Protein, ur: 30 mg/dL — AB
Specific Gravity, Urine: 1.028 (ref 1.005–1.030)
pH: 6 (ref 5.0–8.0)

## 2020-02-09 LAB — HEMOGLOBIN A1C
Hgb A1c MFr Bld: 5.6 % (ref 4.8–5.6)
Mean Plasma Glucose: 114.02 mg/dL

## 2020-02-09 LAB — LIPID PANEL
Cholesterol: 244 mg/dL — ABNORMAL HIGH (ref 0–200)
HDL: 38 mg/dL — ABNORMAL LOW (ref >40)
LDL Cholesterol: 182 mg/dL — ABNORMAL HIGH (ref 0–99)
Total CHOL/HDL Ratio: 6.4 RATIO
Triglycerides: 122 mg/dL (ref <150)
VLDL: 24 mg/dL (ref 0–40)

## 2020-02-09 LAB — TSH: TSH: 5.44 u[IU]/mL — ABNORMAL HIGH (ref 0.350–4.500)

## 2020-02-09 MED ORDER — ARIPIPRAZOLE 5 MG PO TABS
5.0000 mg | ORAL_TABLET | Freq: Every day | ORAL | Status: DC
  Administered 2020-02-09 – 2020-02-11 (×3): 5 mg via ORAL
  Filled 2020-02-09 (×6): qty 1

## 2020-02-09 MED ORDER — NICOTINE 21 MG/24HR TD PT24
21.0000 mg | MEDICATED_PATCH | Freq: Every day | TRANSDERMAL | Status: DC
  Administered 2020-02-10 – 2020-02-11 (×2): 21 mg via TRANSDERMAL
  Filled 2020-02-09 (×4): qty 1

## 2020-02-09 MED ORDER — LORAZEPAM 0.5 MG PO TABS
0.5000 mg | ORAL_TABLET | Freq: Four times a day (QID) | ORAL | Status: DC | PRN
  Administered 2020-02-10: 0.5 mg via ORAL
  Filled 2020-02-09: qty 1

## 2020-02-09 MED ORDER — SERTRALINE HCL 50 MG PO TABS
50.0000 mg | ORAL_TABLET | Freq: Every day | ORAL | Status: DC
  Administered 2020-02-09 – 2020-02-11 (×3): 50 mg via ORAL
  Filled 2020-02-09 (×6): qty 1

## 2020-02-09 MED ORDER — PNEUMOCOCCAL VAC POLYVALENT 25 MCG/0.5ML IJ INJ
0.5000 mL | INJECTION | INTRAMUSCULAR | Status: AC
  Administered 2020-02-10: 12:00:00 0.5 mL via INTRAMUSCULAR
  Filled 2020-02-09: qty 0.5
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
## 2020-02-09 NOTE — Progress Notes (Signed)
D. Pt is friendly upon approach- smiles during interactions- reports that she is ready to deal with her anxiety "in a healthy way as opposed to using alcohol and cocaine". Pt has been visible in the milieu interacting well with her peers.Pt  currently denies SI/HI and AVH  A. Labs and vitals monitored. Pt given and educated on medications. Pt supported emotionally and encouraged to express concerns and ask questions.   R. Pt remains safe with 15 minute checks. Will continue POC.

## 2020-02-09 NOTE — Progress Notes (Signed)
   02/09/20 2022  Psych Admission Type (Psych Patients Only)  Admission Status Voluntary  Psychosocial Assessment  Patient Complaints None  Eye Contact Fair  Facial Expression Flat  Affect Appropriate to circumstance  Speech Logical/coherent  Interaction Assertive  Motor Activity Other (Comment) (WDL)  Appearance/Hygiene In scrubs  Behavior Characteristics Appropriate to situation  Mood Depressed;Pleasant  Thought Process  Coherency WDL  Content WDL  Delusions None reported or observed  Perception WDL  Hallucination None reported or observed  Judgment Impaired  Confusion None  Danger to Self  Current suicidal ideation? Denies  Danger to Others  Danger to Others None reported or observed

## 2020-02-09 NOTE — [facility] Group Notes (Signed)
[facility] LCSW Group Therapy Note  Date/Time:  02/09/2020 9:00-10:00 or 10:00-11:00AM  Type of Therapy and Topic:  Group Therapy:  Healthy and Unhealthy Supports  Participation Level:  Active   Description of Group:  Patients in this group were introduced to the idea of adding a variety of healthy supports to address the various needs in their lives.Patients discussed what additional healthy supports could be helpful in their recovery and wellness after discharge in order to prevent future hospitalizations.   An emphasis was placed on using counselor, doctor, therapy groups, 12-step groups, and problem-specific support groups to expand supports.  Several songs were played to emphasize points made throughout group.  Therapeutic Goals:   1)  discuss importance of adding supports to stay well once out of the hospital  2)  compare healthy versus unhealthy supports and identify some examples of each  3)  generate ideas and descriptions of healthy supports that can be added  4)  offer mutual support about how to address unhealthy supports  5)  encourage active participation in and adherence to discharge plan    Summary of Patient Progress:  The patient stated that current healthy supports in her life are girlfriend ("who wanted me to get help") while current unhealthy supports include family.  The patient expressed a willingness to add professional outpatient support(s) to help in her recovery journey.   Therapeutic Modalities:   Motivational Interviewing Brief Solution-Focused Therapy  Ambrose Mantle, LCSW

## 2020-02-09 NOTE — [facility] Group Notes (Signed)
Adult Psychoeducational Group Note  Date:  02/09/2020 Time:  11:28 AM  Group Topic/Focus: PROGRESSIVE RELAXATION.Marland Kitchen A group where deep breathing is taught and tensing and relaxation muscle groups is used. Imagery is used as well. Pt;s are asked to imagine 3 pillars that hold them up when they are not able to hold themselves up.   Participation Level:  Minimal  Participation Quality:  Inattentive  Affect:  Flat  Cognitive:  Oriented  Insight: Lacking  Engagement in Group:  Lacking  Modes of Intervention:  Activity  Additional Comments:  Pt came into the group late, and did share a part of her story of why she was here in the hospital. States, "I was cutting myself"  Dione Housekeeper 02/09/2020, 11:28 AM
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
## 2020-02-09 NOTE — Progress Notes (Signed)
   02/09/20 2020  COVID-19 Daily Checkoff  Have you had a fever (temp > 37.80C/100F)  in the past 24 hours?  No  If you have had runny nose, nasal congestion, sneezing in the past 24 hours, has it worsened? No  COVID-19 EXPOSURE  Have you traveled outside the state in the past 14 days? No  Have you been in contact with someone with a confirmed diagnosis of COVID-19 or PUI in the past 14 days without wearing appropriate PPE? No  Have you been living in the same home as a person with confirmed diagnosis of COVID-19 or a PUI (household contact)? No  Have you been diagnosed with COVID-19? No

## 2020-02-09 NOTE — Tx Team (Signed)
Initial Treatment Plan 02/09/2020 1:14 AM Adrienne Maldonado DUP:[id number]    PATIENT STRESSORS:    PATIENT STRENGTHS: Ability for insight   PATIENT IDENTIFIED PROBLEMS: Substance abuse-Cocaine and Marijuana  Depression over mother's death 3 years ago                   DISCHARGE CRITERIA:  Ability to meet basic life and health needs  PRELIMINARY DISCHARGE PLAN: Attend aftercare/continuing care group  PATIENT/FAMILY INVOLVEMENT: This treatment plan has been presented to and reviewed with the patient, Adrienne Maldonado.  The patient and family have been given the opportunity to ask questions and make suggestions.  Adrienne Blue, RN 02/09/2020, 1:14 AM

## 2020-02-10 DIAGNOSIS — F1410 Cocaine abuse, uncomplicated: Secondary | ICD-10-CM | POA: Diagnosis not present

## 2020-02-10 DIAGNOSIS — F12288 Cannabis dependence with other cannabis-induced disorder: Secondary | ICD-10-CM | POA: Diagnosis not present

## 2020-02-10 LAB — T4, FREE: Free T4: 0.8 ng/dL (ref 0.61–1.12)

## 2020-02-10 MED ORDER — HYDROCHLOROTHIAZIDE 12.5 MG PO CAPS
12.5000 mg | ORAL_CAPSULE | Freq: Every day | ORAL | Status: DC
  Administered 2020-02-10 – 2020-02-11 (×2): 12.5 mg via ORAL
  Filled 2020-02-10 (×5): qty 1

## 2020-02-10 MED ORDER — ALBUTEROL SULFATE HFA 108 (90 BASE) MCG/ACT IN AERS
1.0000 | INHALATION_SPRAY | Freq: Four times a day (QID) | RESPIRATORY_TRACT | Status: DC | PRN
  Administered 2020-02-10 (×2): 2 via RESPIRATORY_TRACT
  Filled 2020-02-10: qty 6.7

## 2020-02-10 NOTE — Progress Notes (Signed)
DAR NOTE: Patient presents with anxious affect and depressed mood.  Denies suicidal thoughts,  pain, auditory and visual hallucinations.  Rates depression at 8, hopelessness at 6, and anxiety at 8.  Maintained on routine safety checks.  Medications given as prescribed.  Support and encouragement offered as needed.  Attended group and participated.  States goal for today is "talk to the Child psychotherapist."  Patient observed socializing with peers in the dayroom.  Patient is safe on and off the unit.  Tylenol 650 mg given for complain of pain with good effect.

## 2020-02-10 NOTE — Tx Team (Signed)
Interdisciplinary Treatment and Diagnostic Plan Update  02/10/2020 Time of Session: 9:00am Adrienne Maldonado MRN: [id number]  Principal Diagnosis: <principal problem not specified>  Secondary Diagnoses: Active Problems:   Severe recurrent major depression without psychotic features ([place])   Current Medications:  Current Facility-Administered Medications  Medication Dose Route Frequency Provider Last Rate Last Admin  . acetaminophen (TYLENOL) tablet 650 mg  650 mg Oral Q6H PRN Lindon Romp A, NP      . ARIPiprazole (ABILIFY) tablet 5 mg  5 mg Oral Daily Cobos, Myer Peer, MD   5 mg at 02/10/20 0808  . LORazepam (ATIVAN) tablet 0.5 mg  0.5 mg Oral Q6H PRN Cobos, Myer Peer, MD      . nicotine (NICODERM CQ - dosed in mg/24 hours) patch 21 mg  21 mg Transdermal Daily Cobos, Myer Peer, MD   21 mg at 02/10/20 0807  . pneumococcal 23 valent vaccine (PNEUMOVAX-23) injection 0.5 mL  0.5 mL Intramuscular Tomorrow-1000 Cobos, Fernando A, MD      . sertraline (ZOLOFT) tablet 50 mg  50 mg Oral Daily Cobos, Myer Peer, MD   50 mg at 02/10/20 0808  . traZODone (DESYREL) tablet 50 mg  50 mg Oral QHS PRN Rozetta Nunnery, NP   50 mg at 02/09/20 2055   PTA Medications: Medications Prior to Admission  Medication Sig Dispense Refill Last Dose  . Prenatal Multivit-Min-Fe-FA (PRENATAL VITAMINS) 0.8 MG tablet Take 1 tablet by mouth daily. (Patient not taking: Reported on 02/08/2020) 30 tablet 0     Patient Stressors:    Patient Strengths: Ability for insight  Treatment Modalities: Medication Management, Group therapy, Case management,  1 to 1 session with clinician, Psychoeducation, Recreational therapy.   Physician Treatment Plan for Primary Diagnosis: <principal problem not specified> Long Term Goal(s): Improvement in symptoms so as ready for discharge Improvement in symptoms so as ready for discharge   Short Term Goals: Ability to identify changes in lifestyle to reduce recurrence of condition will  improve Ability to verbalize feelings will improve Ability to disclose and discuss suicidal ideas Ability to demonstrate self-control will improve Ability to identify and develop effective coping behaviors will improve Ability to maintain clinical measurements within normal limits will improve Compliance with prescribed medications will improve Ability to identify changes in lifestyle to reduce recurrence of condition will improve Ability to verbalize feelings will improve Ability to disclose and discuss suicidal ideas Ability to demonstrate self-control will improve Ability to identify and develop effective coping behaviors will improve Ability to maintain clinical measurements within normal limits will improve Compliance with prescribed medications will improve  Medication Management: Evaluate patient's response, side effects, and tolerance of medication regimen.  Therapeutic Interventions: 1 to 1 sessions, Unit Group sessions and Medication administration.  Evaluation of Outcomes: Not Met  Physician Treatment Plan for Secondary Diagnosis: Active Problems:   Severe recurrent major depression without psychotic features ([place])  Long Term Goal(s): Improvement in symptoms so as ready for discharge Improvement in symptoms so as ready for discharge   Short Term Goals: Ability to identify changes in lifestyle to reduce recurrence of condition will improve Ability to verbalize feelings will improve Ability to disclose and discuss suicidal ideas Ability to demonstrate self-control will improve Ability to identify and develop effective coping behaviors will improve Ability to maintain clinical measurements within normal limits will improve Compliance with prescribed medications will improve Ability to identify changes in lifestyle to reduce recurrence of condition will improve Ability to verbalize feelings will improve Ability to  disclose and discuss suicidal ideas Ability to demonstrate  self-control will improve Ability to identify and develop effective coping behaviors will improve Ability to maintain clinical measurements within normal limits will improve Compliance with prescribed medications will improve     Medication Management: Evaluate patient's response, side effects, and tolerance of medication regimen.  Therapeutic Interventions: 1 to 1 sessions, Unit Group sessions and Medication administration.  Evaluation of Outcomes: Not Met   RN Treatment Plan for Primary Diagnosis: <principal problem not specified> Long Term Goal(s): Knowledge of disease and therapeutic regimen to maintain health will improve  Short Term Goals: Ability to participate in decision making will improve, Ability to verbalize feelings will improve, Ability to disclose and discuss suicidal ideas, Ability to identify and develop effective coping behaviors will improve and Compliance with prescribed medications will improve  Medication Management: RN will administer medications as ordered by provider, will assess and evaluate patient's response and provide education to patient for prescribed medication. RN will report any adverse and/or side effects to prescribing provider.  Therapeutic Interventions: 1 on 1 counseling sessions, Psychoeducation, Medication administration, Evaluate responses to treatment, Monitor vital signs and CBGs as ordered, Perform/monitor CIWA, COWS, AIMS and Fall Risk screenings as ordered, Perform wound care treatments as ordered.  Evaluation of Outcomes: Not Met   LCSW Treatment Plan for Primary Diagnosis: <principal problem not specified> Long Term Goal(s): Safe transition to appropriate next level of care at discharge, Engage patient in therapeutic group addressing interpersonal concerns.  Short Term Goals: Engage patient in aftercare planning with referrals and resources  Therapeutic Interventions: Assess for all discharge needs, 1 to 1 time with Social worker,  Explore available resources and support systems, Assess for adequacy in community support network, Educate family and significant other(s) on suicide prevention, Complete Psychosocial Assessment, Interpersonal group therapy.  Evaluation of Outcomes: Not Met   Progress in Treatment: Attending groups: Yes. Participating in groups: Yes. Taking medication as prescribed: Yes. Toleration medication: Yes. Family/Significant other contact made: No, will contact:  the patient's girlfriend Patient understands diagnosis: Yes. Discussing patient identified problems/goals with staff: Yes. Medical problems stabilized or resolved: Yes. Denies suicidal/homicidal ideation: Yes. Issues/concerns per patient self-inventory: No. Other:   New problem(s) identified: None   New Short Term/Long Term Goal(s):Detox, medication stabilization, elimination of SI thoughts, development of comprehensive mental wellness plan.    Patient Goals:  "To get my anxiety under control"   Discharge Plan or Barriers: Patient recently admitted. CSW will continue to follow and assess for appropriate referrals and possible discharge planning.    Reason for Continuation of Hospitalization: Depression Medication stabilization  Estimated Length of Stay: 3-5 days   Attendees: Patient: Adrienne Maldonado  02/10/2020 9:22 AM  Physician: Dr. Neita Garnet, MD 02/10/2020 9:22 AM  Nursing:  02/10/2020 9:22 AM  RN Care Manager: 02/10/2020 9:22 AM  Social Worker: Radonna Ricker, LCSW 02/10/2020 9:22 AM  Recreational Therapist:  02/10/2020 9:22 AM  Other:  02/10/2020 9:22 AM  Other:  02/10/2020 9:22 AM  Other: 02/10/2020 9:22 AM    Scribe for Treatment Team: Marylee Floras, [place] 02/10/2020 9:22 AM

## 2020-02-10 NOTE — Progress Notes (Signed)
[facility] MD Progress Note  02/10/2020 11:37 AM Adrienne Maldonado  MRN:  [id number] Subjective: Patient reports she is feeling better today.  Denies suicidal ideations.  Today presents future oriented and is focusing more on discharge planning, hoping for discharge soon. Currently denies medication side effects. Objective: Have discussed case with treatment team and met with patient. 24 year old female, presented on 4/17 after an episode of cutting herself on forearm and legs.  She has several superficial cuts/scratches on left forearm.  She has had past history of self cutting but had not engaged in self-injurious behaviors over the last 2 years.  She reports she has been struggling with depression which she describes as chronic.  States she has felt depressed since she was pregnant 2-1/2 years ago.  She endorses some passive SI and intermittent auditory hallucination telling her to "do it" endorses some neurovegetative symptoms.  Of note, describes daily cannabis use and intermittent cocaine use.  Admission UDS is positive for both cocaine and cannabis.  Today patient presents alert, attentive, calm, pleasant on approach. She describes improving mood and states she is feeling better than on admission.  Currently denies suicidal ideations.  Denies hallucinations and does not appear internally preoccupied. She is tolerating medications well thus far, a lot of or side effects.  No disruptive or agitated he was on unit, visible in dayroom. BP has trended high-most recent readings 135/114, 171/101, 150/88.  She denies associated symptoms although does endorse mild headache.  Denies chest pain or shortness of breath.  She denies history of hypertension but states "it runs in my family, my mother has high blood pressure". Patient also endorses some urinary urgency, denies dysuria, denies pain, no fever, no chills. UA - moderate leukocytes, rare bacteria, no nitrites   She presses interest in being tested for  STDs.    Principal Problem: Depression, Substance Use Disorder Diagnosis: Active Problems:   Severe recurrent major depression without psychotic features ([place])  Total Time spent with patient: 20 minutes  Past Psychiatric History:   Past Medical History:  Past Medical History:  Diagnosis Date  . Asthma     Past Surgical History:  Procedure Laterality Date  . APPENDECTOMY    . CESAREAN SECTION N/A 07/10/2018   Procedure: CESAREAN SECTION;  Surgeon: Aletha Halim, MD;  Location: [place];  Service: Obstetrics;  Laterality: N/A;  . ROOT CANAL     Family History:  Family History  Problem Relation Age of Onset  . Diabetes Other   . Hypertension Other   . CAD Other    Family Psychiatric  History:  Social History:  Social History   Substance and Sexual Activity  Alcohol Use Yes     Social History   Substance and Sexual Activity  Drug Use Yes  . Types: Marijuana    Social History   Socioeconomic History  . Marital status: Single    Spouse name: Not on file  . Number of children: Not on file  . Years of education: Not on file  . Highest education level: Not on file  Occupational History  . Not on file  Tobacco Use  . Smoking status: Current Every Day Smoker    Packs/day: 0.25    Types: Cigarettes  . Smokeless tobacco: Never Used  Substance and Sexual Activity  . Alcohol use: Yes  . Drug use: Yes    Types: Marijuana  . Sexual activity: Not Currently    Birth control/protection: None  Other Topics Concern  . Not on  file  Social History Narrative  . Not on file   Social Determinants of Health   Financial Resource Strain:   . Difficulty of Paying Living Expenses:   Food Insecurity:   . Worried About Charity fundraiser in the Last Year:   . Arboriculturist in the Last Year:   Transportation Needs:   . Film/video editor (Medical):   Marland Kitchen Lack of Transportation (Non-Medical):   Physical Activity:   . Days of Exercise per Week:   .  Minutes of Exercise per Session:   Stress:   . Feeling of Stress :   Social Connections:   . Frequency of Communication with Friends and Family:   . Frequency of Social Gatherings with Friends and Family:   . Attends Religious Services:   . Active Member of Clubs or Organizations:   . Attends Archivist Meetings:   Marland Kitchen Marital Status:    Additional Social History:   Sleep: Improved  Appetite:  Good  Current Medications: Current Facility-Administered Medications  Medication Dose Route Frequency Provider Last Rate Last Admin  . acetaminophen (TYLENOL) tablet 650 mg  650 mg Oral Q6H PRN Lindon Romp A, NP      . albuterol (VENTOLIN HFA) 108 (90 Base) MCG/ACT inhaler 1-2 puff  1-2 puff Inhalation Q6H PRN Connye Burkitt, NP      . ARIPiprazole (ABILIFY) tablet 5 mg  5 mg Oral Daily Wilkie Zenon, Myer Peer, MD   5 mg at 02/10/20 0808  . hydrochlorothiazide (MICROZIDE) capsule 12.5 mg  12.5 mg Oral Daily Gustabo Gordillo A, MD      . LORazepam (ATIVAN) tablet 0.5 mg  0.5 mg Oral Q6H PRN Ayumi Wangerin, Myer Peer, MD      . nicotine (NICODERM CQ - dosed in mg/24 hours) patch 21 mg  21 mg Transdermal Daily Cyleigh Massaro, Myer Peer, MD   21 mg at 02/10/20 0807  . pneumococcal 23 valent vaccine (PNEUMOVAX-23) injection 0.5 mL  0.5 mL Intramuscular Tomorrow-1000 Beaux Wedemeyer A, MD      . sertraline (ZOLOFT) tablet 50 mg  50 mg Oral Daily Hennie Gosa, Myer Peer, MD   50 mg at 02/10/20 0808  . traZODone (DESYREL) tablet 50 mg  50 mg Oral QHS PRN Rozetta Nunnery, NP   50 mg at 02/09/20 2055    Lab Results:  Results for orders placed or performed during the hospital encounter of 02/08/20 (from the past 48 hour(s))  Hemoglobin A1c     Status: None   Collection Time: 02/09/20  6:41 AM  Result Value Ref Range   Hgb A1c MFr Bld 5.6 4.8 - 5.6 %    Comment: (NOTE) Pre diabetes:          5.7%-6.4% Diabetes:              >6.4% Glycemic control for   <7.0% adults with diabetes    Mean Plasma Glucose 114.02 mg/dL     Comment: Performed at [facility] [address] [place], [place] [address]  Lipid panel     Status: Abnormal   Collection Time: 02/09/20  6:41 AM  Result Value Ref Range   Cholesterol 244 (H) 0 - 200 mg/dL   Triglycerides 122 <150 mg/dL   HDL 38 (L) >40 mg/dL   Total CHOL/HDL Ratio 6.4 RATIO   VLDL 24 0 - 40 mg/dL   LDL Cholesterol 182 (H) 0 - 99 mg/dL    Comment:  Total Cholesterol/HDL:CHD Risk Coronary Heart Disease Risk Table                     Men   Women  1/2 Average Risk   3.4   3.3  Average Risk       5.0   4.4  2 X Average Risk   9.6   7.1  3 X Average Risk  23.4   11.0        Use the calculated Patient Ratio above and the CHD Risk Table to determine the patient's CHD Risk.        ATP III CLASSIFICATION (LDL):  <100     mg/dL   Optimal  100-129  mg/dL   Near or Above                    Optimal  130-159  mg/dL   Borderline  160-189  mg/dL   High  >190     mg/dL   Very High Performed at [facility] [address], [place], [place] [address]   TSH     Status: Abnormal   Collection Time: 02/09/20  6:41 AM  Result Value Ref Range   TSH 5.440 (H) 0.350 - 4.500 uIU/mL    Comment: Performed by a 3rd Generation assay with a functional sensitivity of <=0.01 uIU/mL. Performed at [facility], [place] [address] [place], [place] [address]   Urinalysis, Complete w Microscopic     Status: Abnormal   Collection Time: 02/09/20  4:09 PM  Result Value Ref Range   Color, Urine YELLOW YELLOW   APPearance CLOUDY (A) CLEAR   Specific Gravity, Urine 1.028 1.005 - 1.030   pH 6.0 5.0 - 8.0   Glucose, UA NEGATIVE NEGATIVE mg/dL   Hgb urine dipstick NEGATIVE NEGATIVE   Bilirubin Urine NEGATIVE NEGATIVE   Ketones, ur 5 (A) NEGATIVE mg/dL   Protein, ur 30 (A) NEGATIVE mg/dL   Nitrite NEGATIVE NEGATIVE   Leukocytes,Ua MODERATE (A) NEGATIVE   RBC / HPF 11-20 0 - 5 RBC/hpf   WBC, UA 21-50 0 - 5 WBC/hpf   Bacteria, UA RARE (A) NONE  SEEN   Squamous Epithelial / LPF 21-50 0 - 5   Mucus PRESENT     Comment: Performed at [facility], [place] [address] [place], [place] [address]  T4, free     Status: None   Collection Time: 02/10/20  6:33 AM  Result Value Ref Range   Free T4 0.80 0.61 - 1.12 ng/dL    Comment: (NOTE) Biotin ingestion may interfere with free T4 tests. If the results are inconsistent with the TSH level, previous test results, or the clinical presentation, then consider biotin interference. If needed, order repeat testing after stopping biotin. Performed at [facility], [place] [address], [place], [place] [address]     Blood Alcohol level:  Lab Results  Component Value Date   ETH <10 97/35/3299    Metabolic Disorder Labs: Lab Results  Component Value Date   HGBA1C 5.6 02/09/2020   MPG 114.02 02/09/2020   No results found for: PROLACTIN Lab Results  Component Value Date   CHOL 244 (H) 02/09/2020   TRIG 122 02/09/2020   HDL 38 (L) 02/09/2020   CHOLHDL 6.4 02/09/2020   VLDL 24 02/09/2020   LDLCALC 182 (H) 02/09/2020    Physical Findings: AIMS: Facial and Oral Movements Muscles of Facial Expression: None, normal Lips and Perioral  Area: None, normal Jaw: None, normal Tongue: None, normal,Extremity Movements Upper (arms, wrists, hands, fingers): None, normal Lower (legs, knees, ankles, toes): None, normal, Trunk Movements Neck, shoulders, hips: None, normal, Overall Severity Severity of abnormal movements (highest score from questions above): None, normal Incapacitation due to abnormal movements: None, normal Patient's awareness of abnormal movements (rate only patient's report): No Awareness, Dental Status Current problems with teeth and/or dentures?: No Does patient usually wear dentures?: No  CIWA:    COWS:     Musculoskeletal: Strength & Muscle Tone: within normal limits-no tremors, no diaphoresis, no restlessness, no psychomotor agitation Gait & Station:  normal Patient leans: N/A  Psychiatric Specialty Exam: Physical Exam  Review of Systems reports mild headache, denies chest pain, no shortness of breath, no nausea, no vomiting, reports urinary urgency without dysuria ,no fever, no chills  Blood pressure (!) 150/88, pulse 69, temperature 97.6 F (36.4 C), temperature source Oral, resp. rate 20, height 5' 6"  (1.676 m), weight 113.9 kg, last menstrual period 02/01/2020, SpO2 98 %, unknown if currently breastfeeding.Body mass index is 40.51 kg/m.  General Appearance: Improved grooming  Eye Contact:  Good  Speech:  Normal Rate  Volume:  Normal  Mood:  Improved mood, currently presents with a fuller range of affect, states she feels better than on admission  Affect:  Affect becoming more reactive  Thought Process:  Linear and Descriptions of Associations: Intact  Orientation:  Full (Time, Place, and Person)  Thought Content:  No hallucinations, no delusions, not internally preoccupied  Suicidal Thoughts:  No currently denies suicidal or self-injurious ideations, also denies homicidal or violent ideations  Homicidal Thoughts:  No  Memory:  Recent and remote grossly intact  Judgement:  Other:  Improving  Insight:  Fair/improving  Psychomotor Activity:  Normal-no psychomotor agitation or restlessness, presents calm, comfortable  Concentration:  Concentration: Good and Attention Span: Good  Recall:  Good  Fund of Knowledge:  Good  Language:  Good  Akathisia:  Negative  Handed:  Right  AIMS (if indicated):     Assets:  Communication Skills Desire for Improvement Resilience  ADL's:  Intact  Cognition:  WNL  Sleep:  Number of Hours: 5.25   Assessment: 24 year old female, presented on 4/17 after an episode of cutting herself on forearm and legs.  She has several superficial cuts/scratches on left forearm.  She has had past history of self cutting but had not engaged in self-injurious behaviors over the last 2 years.  She reports she has  been struggling with depression which she describes as chronic.  States she has felt depressed since she was pregnant 2-1/2 years ago.  She endorses some passive SI and intermittent auditory hallucination telling her to "do it" endorses some neurovegetative symptoms.  Of note, describes daily cannabis use and intermittent cocaine use.  Admission UDS is positive for both cocaine and cannabis.  Patient reports improving mood and feels better today.  Her affect is reactive/appropriate.  She denies any suicidal or self-injurious ideations, denies any persistent or lingering auditory hallucinations, and currently presents future oriented.  Behavior on unit in good control.  She is tolerating medications well thus far, denies side effects.  BP readings have trended high and she reports a family history of hypertension.  I spoke with hospitalist, recommended to start hydrochlorothiazide management. Side effects reviewed .   Treatment Plan Summary: Daily contact with patient to assess and evaluate symptoms and progress in treatment, Medication management, Plan Inpatient treatment and Medications as below Encourage group  and milieu participation Encourage efforts to work on sobriety and relapse prevention Continue Abilify 5 mg daily for mood disorder/psychosis Continue Zoloft 50 mg daily for depression, anxiety Continue nicotine patch for nicotine withdrawal symptoms Start hydrochlorothiazide at 12.5 mg daily for hypertension Continue albuterol inhaler as needed for wheezing or shortness of breath Continue Ativan 0.5 mg every 6 hours as needed for anxiety Continue Trazodone 50 mg nightly as needed for insomnia As per patient request will order STD work-up.  Treatment team working on disposition planning options. Jenne Campus, MD 02/10/2020, 11:37 AM

## 2020-02-10 NOTE — BHH Group Notes (Signed)
Adult Psychoeducational Group Note  Date:  02/10/2020 Time:  8:53 PM  Group Topic/Focus:  Wrap-Up Group:   The focus of this group is to help patients review their daily goal of treatment and discuss progress on daily workbooks.  Participation Level:  Active  Participation Quality:  Appropriate and Attentive  Affect:  Appropriate  Cognitive:  Alert and Appropriate  Insight: Appropriate and Good  Engagement in Group:  Engaged  Modes of Intervention:  Discussion and Education  Additional Comments:  Pt attended and participated in wrap up group this evening and rated their day a 9/10. Pt does not want to be here, but they are beginning to feel better. Pt completed their goal, which was to start feeling better.   Chrisandra Netters 02/10/2020, 8:53 PM

## 2020-02-10 NOTE — Progress Notes (Signed)
Recreation Therapy Notes  Date:  4.19.21 Time: 0930 Location: [address] Group Room  Group Topic: Stress Management  Goal Area(s) Addresses:  Patient will identify positive stress management techniques. Patient will identify benefits of using stress management post d/c.  Intervention:  Stress Management  Activity :  Guided Imagery.  LRT was to read a script that focused on envisioning your peaceful place.  Patients were to listen and follow along as script was read to engage in activity.  Education:  Stress Management, Discharge Planning.   Education Outcome: Acknowledges Education  Clinical Observations/Feedback: Pt did not attend group activity.    Caroll Rancher, LRT/CTRS         Caroll Rancher A 02/10/2020 11:24 AM

## 2020-02-10 NOTE — [facility] Group Notes (Signed)
LCSW Group Therapy Note 02/10/2020 2:06 PM  Type of Therapy and Topic: Group Therapy: Overcoming Obstacles  Participation Level: Active  Description of Group:  In this group patients will be encouraged to explore what they see as obstacles to their own wellness and recovery. They will be guided to discuss their thoughts, feelings, and behaviors related to these obstacles. The group will process together ways to cope with barriers, with attention given to specific choices patients can make. Each patient will be challenged to identify changes they are motivated to make in order to overcome their obstacles. This group will be process-oriented, with patients participating in exploration of their own experiences as well as giving and receiving support and challenge from other group members.  Therapeutic Goals: 1. Patient will identify personal and current obstacles as they relate to admission. 2. Patient will identify barriers that currently interfere with their wellness or overcoming obstacles.  3. Patient will identify feelings, thought process and behaviors related to these barriers. 4. Patient will identify two changes they are willing to make to overcome these obstacles:   Summary of Patient Progress Adrienne Maldonado was engaged and participated throughout the group session. Adrienne Maldonado reports her main obstacle is being away from her son. She reports she is glad she came to the hospital for help, however she is anticipates seeing her son soon.     Therapeutic Modalities:  Cognitive Behavioral Therapy Solution Focused Therapy Motivational Interviewing Relapse Prevention Therapy   Alcario Drought Clinical Social Worker

## 2020-02-11 DIAGNOSIS — F332 Major depressive disorder, recurrent severe without psychotic features: Secondary | ICD-10-CM | POA: Diagnosis not present

## 2020-02-11 LAB — T3, FREE: T3, Free: 3.4 pg/mL (ref 2.0–4.4)

## 2020-02-11 LAB — RAPID HIV SCREEN (HIV 1/2 AB+AG)
HIV 1/2 Antibodies: NONREACTIVE
HIV-1 P24 Antigen - HIV24: NONREACTIVE

## 2020-02-11 LAB — RPR: RPR Ser Ql: NONREACTIVE

## 2020-02-11 MED ORDER — TRAZODONE HCL 50 MG PO TABS: 50.0000 mg | ORAL_TABLET | Freq: Every evening | ORAL | 0 refills | Status: AC | PRN

## 2020-02-11 MED ORDER — ARIPIPRAZOLE 5 MG PO TABS: 5.0000 mg | ORAL_TABLET | Freq: Every day | ORAL | 0 refills | Status: AC

## 2020-02-11 MED ORDER — NICOTINE 21 MG/24HR TD PT24: 21.0000 mg | MEDICATED_PATCH | Freq: Every day | TRANSDERMAL | 0 refills | Status: AC

## 2020-02-11 MED ORDER — SERTRALINE HCL 50 MG PO TABS: 50.0000 mg | ORAL_TABLET | Freq: Every day | ORAL | 0 refills | Status: AC

## 2020-02-11 MED ORDER — HYDROCHLOROTHIAZIDE 12.5 MG PO CAPS: 12.5000 mg | ORAL_CAPSULE | Freq: Every day | ORAL | 0 refills | Status: AC

## 2020-02-11 MED ORDER — ALBUTEROL SULFATE HFA 108 (90 BASE) MCG/ACT IN AERS: 1.0000 | INHALATION_SPRAY | Freq: Four times a day (QID) | RESPIRATORY_TRACT | 0 refills | Status: AC | PRN

## 2020-02-11 NOTE — [facility] Suicide Risk Assessment (Signed)
[facility] Discharge Suicide Risk Assessment   Principal Problem: <principal problem not specified> Discharge Diagnoses: Active Problems:   Severe recurrent major depression without psychotic features ([facility])   Total Time spent with patient: 15 minutes  Musculoskeletal: Strength & Muscle Tone: within normal limits Gait & Station: normal Patient leans: N/A  Psychiatric Specialty Exam: Review of Systems  Blood pressure (!) 138/113, pulse 90, temperature 98.6 F (37 C), temperature source Oral, resp. rate 20, height 5\' 6"  (1.676 m), weight 113.9 kg, last menstrual period 02/01/2020, SpO2 98 %, unknown if currently breastfeeding.Body mass index is 40.51 kg/m.  General Appearance: Casual  Eye Contact::  Good  Speech:  Normal Rate409  Volume:  Normal  Mood:  Euthymic  Affect:  Congruent  Thought Process:  Coherent and Descriptions of Associations: Intact  Orientation:  Full (Time, Place, and Person)  Thought Content:  Logical  Suicidal Thoughts:  No  Homicidal Thoughts:  No  Memory:  Immediate;   Good Recent;   Good Remote;   Good  Judgement:  Intact  Insight:  Good  Psychomotor Activity:  Normal  Concentration:  Good  Recall:  Good  Fund of Knowledge:Good  Language: Good  Akathisia:  Negative  Handed:  Right  AIMS (if indicated):     Assets:  Communication Skills Desire for Improvement Housing Resilience Talents/Skills  Sleep:  Number of Hours: 5.25  Cognition: WNL  ADL's:  Intact   Mental Status Per Nursing Assessment::   On Admission:  Self-harm behaviors  Demographic Factors:  Low socioeconomic status  Loss Factors: Financial problems/change in socioeconomic status  Historical Factors: Impulsivity  Risk Reduction Factors:   Responsible for children under 60 years of age, Sense of responsibility to family, Living with another person, especially a relative and Positive coping skills or problem solving skills  Continued Clinical Symptoms:  Depression:   Comorbid  alcohol abuse/dependence Impulsivity Alcohol/Substance Abuse/Dependencies  Cognitive Features That Contribute To Risk:  None    Suicide Risk:  Minimal: No identifiable suicidal ideation.  Patients presenting with no risk factors but with morbid ruminations; may be classified as minimal risk based on the severity of the depressive symptoms  Follow-up Information    [facility] Follow up on 02/14/2020.   Why: You are scheduled for an appointment on 02/14/20 at 9:30 am.  This will be a virtual tele-health appointment.  Please have your insurance information and your discharge summary available.  Contact information: [address] [place] [phone]           Plan Of Care/Follow-up recommendations:  Activity:  ad lib  [phone], MD 02/11/2020, 8:07 AM

## 2020-02-11 NOTE — Progress Notes (Signed)
  [facility] Adult Case Management Discharge Plan :  Will you be returning to the same living situation after discharge:  Yes,  patient is returning home with her girlfriend At discharge, do you have transportation home?: Yes,  Safe Transport (Lyft) Do you have the ability to pay for your medications: Yes,  Medicaid  Release of information consent forms completed and in the chart;  Patient's signature needed at discharge.  Patient to Follow up at: Follow-up Information    [facility] Follow up on 02/14/2020.   Why: You are scheduled for an appointment on 02/14/20 at 9:30 am.  This will be a virtual tele-health appointment.  Please have your insurance information and your discharge summary available.  Contact information: [address] [place] [address] [phone]           Next level of care provider has access to [facility] Link:yes  Safety Planning and Suicide Prevention discussed: Yes,  with the patient's girlfriend  Have you used any form of tobacco in the last 30 days? (Cigarettes, Smokeless Tobacco, Cigars, and/or Pipes): Yes  Has patient been referred to the Quitline?: Patient refused referral  Patient has been referred for addiction treatment: N/A  Maeola Sarah, LCSWA 02/11/2020, 9:35 AM

## 2020-02-11 NOTE — [facility] Suicide Risk Assessment (Signed)
[facility] INPATIENT:  Family/Significant Other Suicide Prevention Education  Suicide Prevention Education:  Education Completed; with girlfriend, Adrienne Maldonado [phone]) has been identified by the patient as the family member/significant other with whom the patient will be residing, and identified as the person(s) who will aid the patient in the event of a mental health crisis (suicidal ideations/suicide attempt).  With written consent from the patient, the family member/significant other has been provided the following suicide prevention education, prior to the and/or following the discharge of the patient.  The suicide prevention education provided includes the following:  Suicide risk factors  Suicide prevention and interventions  National Suicide Hotline telephone number  [facility] assessment telephone number  [facility] Emergency Assistance 911  [facility] and/or Residential Mobile Crisis Unit telephone number  Request made of family/significant other to:  Remove weapons (e.g., guns, rifles, knives), all items previously/currently identified as safety concern.    Remove drugs/medications (over-the-counter, prescriptions, illicit drugs), all items previously/currently identified as a safety concern.  The family member/significant other verbalizes understanding of the suicide prevention education information provided.  The family member/significant other agrees to remove the items of safety concern listed above.  Patient's girlfriend reports she does not have any questions or concerns at this time. She reports she will ensure that the patient remain compliant with her follow up appointments and medications. Patient's girlfriend states she believes the patient is doing better and anticipates her return home.   Adrienne Maldonado 02/11/2020, 9:15 AM
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
## 2020-02-11 NOTE — Progress Notes (Signed)
Pt discharged to lobby. Pt was stable and appreciative at that time. All papers and prescriptions were given and valuables returned. Verbal understanding expressed. Denies SI/HI and A/VH. Pt given opportunity to express concerns and ask questions.  

## 2020-02-11 NOTE — Progress Notes (Signed)
DAR NOTE: Patient presents with calm affect and pleasant mood.  Denies pain, auditory and visual hallucinations.  Maintained on routine safety checks.  Medications given as prescribed.  Support and encouragement offered as needed.  Will continue to monitor. 

## 2020-02-12 LAB — GC/CHLAMYDIA PROBE AMP (~~LOC~~) NOT AT ARMC
Chlamydia: NEGATIVE
Comment: NEGATIVE
Comment: NORMAL
Neisseria Gonorrhea: NEGATIVE

## 2020-02-17 ENCOUNTER — Telehealth (HOSPITAL_COMMUNITY): Payer: Self-pay

## 2020-02-17 NOTE — Telephone Encounter (Signed)
Pt requesting 90 day supply of Microzide instead of ordered 30 day supply. Please advise.    [facility] pharmacy on [address] if appropriate.

## 2020-04-20 ENCOUNTER — Emergency Department (HOSPITAL_COMMUNITY)
Admission: EM | Admit: 2020-04-20 | Discharge: 2020-04-21 | Disposition: A | Discharge status: Home / Self Care | Payer: Medicaid Other | Attending: Emergency Medicine | Admitting: Emergency Medicine

## 2020-04-20 ENCOUNTER — Encounter (HOSPITAL_COMMUNITY): Payer: Self-pay

## 2020-04-20 DIAGNOSIS — F17210 Nicotine dependence, cigarettes, uncomplicated: Secondary | ICD-10-CM | POA: Insufficient documentation

## 2020-04-20 DIAGNOSIS — R42 Dizziness and giddiness: Secondary | ICD-10-CM | POA: Diagnosis present

## 2020-04-20 DIAGNOSIS — R001 Bradycardia, unspecified: Secondary | ICD-10-CM | POA: Diagnosis not present

## 2020-04-20 DIAGNOSIS — Z79899 Other long term (current) drug therapy: Secondary | ICD-10-CM | POA: Insufficient documentation

## 2020-04-20 DIAGNOSIS — R519 Headache, unspecified: Secondary | ICD-10-CM | POA: Diagnosis not present

## 2020-04-20 DIAGNOSIS — J45909 Unspecified asthma, uncomplicated: Secondary | ICD-10-CM | POA: Insufficient documentation

## 2020-04-20 LAB — BASIC METABOLIC PANEL
Anion gap: 10 (ref 5–15)
BUN: 6 mg/dL (ref 6–20)
CO2: 27 mmol/L (ref 22–32)
Calcium: 9.3 mg/dL (ref 8.9–10.3)
Chloride: 102 mmol/L (ref 98–111)
Creatinine, Ser: 0.81 mg/dL (ref 0.44–1.00)
GFR calc Af Amer: >60 mL/min (ref >60)
GFR calc non Af Amer: >60 mL/min (ref >60)
Glucose, Bld: 87 mg/dL (ref 70–99)
Potassium: 3.7 mmol/L (ref 3.5–5.1)
Sodium: 139 mmol/L (ref 135–145)

## 2020-04-20 LAB — I-STAT BETA HCG BLOOD, ED (MC, WL, AP ONLY): I-stat hCG, quantitative: <5 m[IU]/mL (ref <5)

## 2020-04-20 LAB — CBC
HCT: 39.3 % (ref 36.0–46.0)
Hemoglobin: 12.3 g/dL (ref 12.0–15.0)
MCH: 28.9 pg (ref 26.0–34.0)
MCHC: 31.3 g/dL (ref 30.0–36.0)
MCV: 92.5 fL (ref 80.0–100.0)
Platelets: 230 10*3/uL (ref 150–400)
RBC: 4.25 MIL/uL (ref 3.87–5.11)
RDW: 13.2 % (ref 11.5–15.5)
WBC: 6.1 10*3/uL (ref 4.0–10.5)
nRBC: 0 % (ref 0.0–0.2)

## 2020-04-20 MED ORDER — SODIUM CHLORIDE 0.9% FLUSH: 3.0000 mL | Freq: Once | INTRAVENOUS | Status: DC

## 2020-04-20 NOTE — ED Triage Notes (Signed)
Pt states that she has been dizzy all day on and off like she is going to pass out. Feel like the room is spinning and some nausea. Pt reports that she has been out of her BP meds for one month as well.

## 2020-04-21 ENCOUNTER — Encounter (HOSPITAL_COMMUNITY): Payer: Self-pay | Admitting: Student

## 2020-04-21 ENCOUNTER — Emergency Department (HOSPITAL_COMMUNITY): Payer: Medicaid Other

## 2020-04-21 MED ORDER — SODIUM CHLORIDE 0.9 % IV BOLUS
1000.0000 mL | Freq: Once | INTRAVENOUS | Status: AC
  Administered 2020-04-21: 1000 mL via INTRAVENOUS

## 2020-04-21 MED ORDER — MECLIZINE HCL 25 MG PO TABS: 25.0000 mg | ORAL_TABLET | Freq: Three times a day (TID) | ORAL | 0 refills | Status: AC | PRN

## 2020-04-21 MED ORDER — PROCHLORPERAZINE EDISYLATE 10 MG/2ML IJ SOLN
10.0000 mg | Freq: Once | INTRAMUSCULAR | Status: AC
  Administered 2020-04-21: 10 mg via INTRAVENOUS
  Filled 2020-04-21: qty 2

## 2020-04-21 MED ORDER — DIPHENHYDRAMINE HCL 50 MG/ML IJ SOLN
12.5000 mg | Freq: Once | INTRAMUSCULAR | Status: AC
  Administered 2020-04-21: 12.5 mg via INTRAVENOUS
  Filled 2020-04-21: qty 1

## 2020-04-21 NOTE — ED Provider Notes (Signed)
[facility] EMERGENCY DEPARTMENT Provider Note   CSN: [id number] Arrival date & time: 04/20/20  1927     History Chief Complaint  Patient presents with  . Dizziness    Adrienne Maldonado is a 24 y.o. female with a history of tobacco abuse, asthma, and appendectomy who presents to the ED with complaints of dizziness intermittently x 2 years. Patient states that she has episodes where she has a gradual onset headache with associated dizziness like the room spinning, and blurry vision, she states she has to hold onto something or she will fall, she ultimately has a syncopal episode after about 30 minutes of her symptoms with these episodes.  Sometimes she has nausea & emesis with these. Episodes last about 2 times per week, have been occurring for a couple of years now, she had an episode last night and her grandmother encouraged her to come to the emergency department.  She has not been evaluated by a healthcare provider prior to this.  She currently has a moderate headache, history of similar headaches, no other current symptoms at this time.  Denies fever, chills, numbness, focal weakness, chest pain, dyspnea, leg pain/swelling, hemoptysis, recent surgery/trauma, recent long travel, hormone use, personal hx of cancer, or hx of DVT/PE.  Patient utilizes marijuana, did try cocaine 1 week ago one time, denies any other drug use     HPI     Past Medical History:  Diagnosis Date  . Asthma     Patient Active Problem List   Diagnosis Date Noted  . Severe recurrent major depression without psychotic features (HCC) 02/08/2020  . Encounter for supervision of normal first pregnancy in third trimester 07/10/2018  . Asthma 07/10/2018  . Tobacco smoking affecting pregnancy in third trimester 07/10/2018  . Preterm premature rupture of membranes 07/10/2018    Past Surgical History:  Procedure Laterality Date  . APPENDECTOMY    . CESAREAN SECTION N/A 07/10/2018   Procedure:  CESAREAN SECTION;  Surgeon: [place] Bing, MD;  Location: [facility] BIRTHING SUITES;  Service: Obstetrics;  Laterality: N/A;  . ROOT CANAL       OB History    Gravida  1   Para  1   Term      Preterm  1   AB      Living  1     SAB      TAB      Ectopic      Multiple  0   Live Births  1           Family History  Problem Relation Age of Onset  . Diabetes Other   . Hypertension Other   . CAD Other     Social History   Tobacco Use  . Smoking status: Current Every Day Smoker    Packs/day: 0.25    Types: Cigarettes  . Smokeless tobacco: Never Used  Vaping Use  . Vaping Use: Never used  Substance Use Topics  . Alcohol use: Yes  . Drug use: Yes    Types: Marijuana    Home Medications Prior to Admission medications   Medication Sig Start Date End Date Taking? Authorizing Provider  albuterol (VENTOLIN HFA) 108 (90 Base) MCG/ACT inhaler Inhale 1-2 puffs into the lungs every 6 (six) hours as needed for wheezing or shortness of breath. 02/11/20   Aldean Baker, NP  ARIPiprazole (ABILIFY) 5 MG tablet Take 1 tablet (5 mg total) by mouth daily. 02/12/20   Aldean Baker, NP  hydrochlorothiazide (MICROZIDE) 12.5 MG capsule Take 1 capsule (12.5 mg total) by mouth daily. 02/12/20   Aldean Baker, NP  nicotine (NICODERM CQ - DOSED IN MG/24 HOURS) 21 mg/24hr patch Place 1 patch (21 mg total) onto the skin daily. 02/12/20   Aldean Baker, NP  sertraline (ZOLOFT) 50 MG tablet Take 1 tablet (50 mg total) by mouth daily. 02/12/20   Aldean Baker, NP  traZODone (DESYREL) 50 MG tablet Take 1 tablet (50 mg total) by mouth at bedtime as needed for sleep. 02/11/20   Aldean Baker, NP    Allergies    Bee venom, Concerta [methylphenidate], and Ibuprofen  Review of Systems   Review of Systems  Constitutional: Negative for chills and fever.  Respiratory: Negative for shortness of breath.   Cardiovascular: Negative for chest pain.  Gastrointestinal: Positive for nausea and vomiting.  Negative for abdominal pain.  Genitourinary: Negative for dysuria.  Neurological: Positive for dizziness, syncope and headaches. Negative for seizures, facial asymmetry, speech difficulty, weakness and numbness.  All other systems reviewed and are negative.   Physical Exam Updated Vital Signs BP 126/67 (BP Location: Right Arm)   Pulse (!) 50   Temp 98 F (36.7 C) (Oral)   Resp 10   SpO2 100%   Physical Exam Vitals and nursing note reviewed.  Constitutional:      General: She is not in acute distress.    Appearance: She is well-developed. She is not toxic-appearing.  HENT:     Head: Normocephalic and atraumatic.  Eyes:     General: Vision grossly intact. Gaze aligned appropriately.        Right eye: No discharge.        Left eye: No discharge.     Extraocular Movements: Extraocular movements intact.     Conjunctiva/sclera: Conjunctivae normal.     Comments: PERRL. No proptosis.   Cardiovascular:     Rate and Rhythm: Regular rhythm. Bradycardia present.  Pulmonary:     Effort: Pulmonary effort is normal. No respiratory distress.     Breath sounds: Normal breath sounds. No wheezing, rhonchi or rales.  Abdominal:     General: There is no distension.     Palpations: Abdomen is soft.     Tenderness: There is no abdominal tenderness.  Musculoskeletal:     Cervical back: Normal range of motion and neck supple. No rigidity.     Right lower leg: No edema.     Left lower leg: No edema.  Skin:    General: Skin is warm and dry.     Findings: No rash.  Neurological:     Comments: Alert. Clear speech. No facial droop. CNIII-XII grossly intact. Bilateral upper and lower extremities' sensation grossly intact. 5/5 symmetric strength with grip strength and with plantar and dorsi flexion bilaterally . Normal finger to nose bilaterally. Negative pronator drift. Negative Romberg sign. Gait is steady and intact.   Psychiatric:        Behavior: Behavior normal.     ED Results / Procedures  / Treatments   Labs (all labs ordered are listed, but only abnormal results are displayed) Labs Reviewed  BASIC METABOLIC PANEL  CBC  URINALYSIS, ROUTINE W REFLEX MICROSCOPIC  I-STAT BETA HCG BLOOD, ED ([facility], [facility], [facility] ONLY)  CBG MONITORING, ED    EKG None  Radiology No results found.  Procedures Procedures (including critical care time)  Medications Ordered in ED Medications  sodium chloride flush (NS) 0.9 % injection 3 mL (has  no administration in time range)    ED Course  I have reviewed the triage vital signs and the nursing notes.  Pertinent labs & imaging results that were available during my care of the patient were reviewed by me and considered in my medical decision making (see chart for details).    MDM Rules/Calculators/A&P                         Patient presents to the ED with complaints of dizziness episodes intermittently x 2 years.   Additional history obtained:  Additional history obtained from nursing note and chart reviewed.. Previous records obtained and reviewed.   Lab Tests:  I reviewed and interpreted labs, which included:  CBC: No anemia or leukocytosis. BMP: No significant electrolyte derangement.  Renal function preserved Pregnancy test: Negative  Patient has a completely benign physical exam.  She has no focal neurologic deficits. Head CT & migraine cocktail ordered.   Imaging Studies ordered:  I ordered imaging studies which included CT head wo contrast, I independently visualized and interpreted imaging which showed no acute process.   Patient given migraine cocktail with improvement of her sxs. She is ambulatory in the ED without difficulty, remains without focal neuro deficits- her presentation does not seem consistent w/ acute CVA. She is afebrile without nuchal rigidity to suggest meningitis. Frequent headaches over years, no acute worsened/sudden headache- feel SAH is less likely. No visual deficits, no unilateral visual complaints, no  proptosis- less likely giant cell arteritis in this age, acute angle closure glaucoma, or dural venous sinus thrombosis.CT without obvious mass. Cardiac monitor without arrhythmia. Unclear cause of patient's sxs, however been occurring intermittently for years. Discussed findings and plan of care with supervising physician Dr. Judd Lien- recommends discharge with neurology follow up given chronicity and reassuring work-up today which I am in agreement with. Will also provide cardiology follow up as well. Will provide PRN meclizine for trial of tx of sxs. I discussed results, treatment plan, need for follow-up, and return precautions with the patient. Provided opportunity for questions, patient confirmed understanding and is in agreement with plan.   Findings and plan of care discussed with supervising physician Dr. Judd Lien who is in agreement.   Portions of this note were generated with Scientist, clinical (histocompatibility and immunogenetics). Dictation errors may occur despite best attempts at proofreading.  Final Clinical Impression(s) / ED Diagnoses Final diagnoses:  Dizziness    Rx / DC Orders ED Discharge Orders         Ordered    meclizine (ANTIVERT) 25 MG tablet  3 times daily PRN     Discontinue  Reprint     04/21/20 1247    Ambulatory referral to Neurology     Discontinue  Reprint    Comments: An appointment is requested in approximately: 1 week   04/21/20 [address], [place], PA-C 04/21/20 1256    Geoffery Lyons, MD 04/21/20 1430

## 2020-04-21 NOTE — ED Notes (Signed)
Patient verbalizes understanding of discharge instructions. Opportunity for questioning and answers were provided. Armband removed by staff, pt discharged from ED.  

## 2020-04-21 NOTE — Discharge Instructions (Addendum)
You were seen in the ER today for headache and dizziness.  Your CT scan did not show any significant abnormalities.  Your labs were normal.  We are sending home with meclizine to take every 8 hours as needed for dizziness.  Please take Tylenol and or Motrin per over-the-counter dosing to help with any head discomfort.  We would like you to follow-up with a neurologist as well as her cardiologist within the next 3 to 5 days for further evaluation of your recurrent dizziness and passing out episodes.  Return to the emergency department for new or worsening symptoms including but not limited to worsening pain, recurrent passing out, inability to keep fluids down, fever, numbness, weakness, or any other concerns.
